# Patient Record
Sex: Female | Born: 1940 | Race: White | Hispanic: No | Marital: Married | Smoking: Never smoker
Health system: Southern US, Community
[De-identification: ages and names within clinical notes are randomized; demographics above are authoritative.]

## PROBLEM LIST (undated history)

## (undated) DIAGNOSIS — M8580 Other specified disorders of bone density and structure, unspecified site: Secondary | ICD-10-CM

## (undated) DIAGNOSIS — H269 Unspecified cataract: Secondary | ICD-10-CM

## (undated) DIAGNOSIS — N189 Chronic kidney disease, unspecified: Secondary | ICD-10-CM

## (undated) DIAGNOSIS — E079 Disorder of thyroid, unspecified: Secondary | ICD-10-CM

## (undated) DIAGNOSIS — T7840XA Allergy, unspecified, initial encounter: Secondary | ICD-10-CM

## (undated) HISTORY — PX: BLADDER SUSPENSION: SHX72

## (undated) HISTORY — PX: POLYPECTOMY: SHX149

## (undated) HISTORY — PX: BREAST REDUCTION SURGERY: SHX8

## (undated) HISTORY — DX: Other specified disorders of bone density and structure, unspecified site: M85.80

## (undated) HISTORY — PX: COLONOSCOPY: SHX174

## (undated) HISTORY — DX: Unspecified cataract: H26.9

## (undated) HISTORY — DX: Chronic kidney disease, unspecified: N18.9

## (undated) HISTORY — DX: Allergy, unspecified, initial encounter: T78.40XA

## (undated) HISTORY — PX: COSMETIC SURGERY: SHX468

---

## 1998-11-04 ENCOUNTER — Other Ambulatory Visit: Admission: RE | Admit: 1998-11-04 | Discharge: 1998-11-04 | Payer: Self-pay | Admitting: Obstetrics and Gynecology

## 2000-01-13 ENCOUNTER — Other Ambulatory Visit: Admission: RE | Admit: 2000-01-13 | Discharge: 2000-01-13 | Payer: Self-pay | Admitting: *Deleted

## 2001-01-12 ENCOUNTER — Other Ambulatory Visit: Admission: RE | Admit: 2001-01-12 | Discharge: 2001-01-12 | Payer: Self-pay | Admitting: *Deleted

## 2002-02-01 ENCOUNTER — Other Ambulatory Visit: Admission: RE | Admit: 2002-02-01 | Discharge: 2002-02-01 | Payer: Self-pay | Admitting: Obstetrics and Gynecology

## 2002-10-12 ENCOUNTER — Encounter: Payer: Self-pay | Admitting: Internal Medicine

## 2002-11-09 ENCOUNTER — Encounter: Payer: Self-pay | Admitting: Internal Medicine

## 2003-02-05 ENCOUNTER — Other Ambulatory Visit: Admission: RE | Admit: 2003-02-05 | Discharge: 2003-02-05 | Payer: Self-pay | Admitting: Obstetrics and Gynecology

## 2004-09-30 ENCOUNTER — Ambulatory Visit: Payer: Self-pay

## 2005-08-27 ENCOUNTER — Encounter: Admission: RE | Admit: 2005-08-27 | Discharge: 2005-08-27 | Payer: Self-pay | Admitting: Internal Medicine

## 2005-10-04 ENCOUNTER — Emergency Department (HOSPITAL_COMMUNITY): Admission: EM | Admit: 2005-10-04 | Discharge: 2005-10-04 | Payer: Self-pay | Admitting: Emergency Medicine

## 2006-08-11 ENCOUNTER — Ambulatory Visit (HOSPITAL_COMMUNITY): Admission: RE | Admit: 2006-08-11 | Discharge: 2006-08-11 | Payer: Self-pay | Admitting: Obstetrics and Gynecology

## 2009-01-16 DIAGNOSIS — R110 Nausea: Secondary | ICD-10-CM

## 2009-01-16 DIAGNOSIS — E039 Hypothyroidism, unspecified: Secondary | ICD-10-CM | POA: Insufficient documentation

## 2009-01-16 DIAGNOSIS — R197 Diarrhea, unspecified: Secondary | ICD-10-CM

## 2009-01-17 ENCOUNTER — Ambulatory Visit: Payer: Self-pay | Admitting: Internal Medicine

## 2009-01-18 ENCOUNTER — Telehealth: Payer: Self-pay | Admitting: Internal Medicine

## 2009-01-28 ENCOUNTER — Telehealth: Payer: Self-pay | Admitting: Internal Medicine

## 2009-01-30 ENCOUNTER — Encounter: Payer: Self-pay | Admitting: Internal Medicine

## 2010-08-05 NOTE — Letter (Signed)
Summary: Letter to Dr. Waynard Edwards  Letter to Dr. Waynard Edwards   Imported By: Harlow Mares CMA (AAMA) 01/17/2009 14:56:01  _____________________________________________________________________  External Attachment:    Type:   Image     Comment:   External Document

## 2010-11-21 NOTE — Letter (Signed)
January 18, 2009    Mark A. Perini, MD  [address]  [place], [place] [address]   RE:  Pamela, Tran  MRN:  [id number]  /  DOB:  [birth year]   Dear Loraine Leriche:   I saw [name] in the office yesterday for occasional fecal  incontinence and fecal urgency.  One of the possible causes of her  condition may be her Levoxyl dose which she takes for hypothyroidism.  Her TSH is normal, but on the low side.  I also found out that she may  have a pelvic relaxation and decreased rectal sphincter tone, but I  would like to ask if you could look into her dose of the Levoxyl and  consider reducing the dose to see whether her bowel habits would  normalize.  I have put her on Levbid as an antispasmodic.  Her tissue  transglutaminase levels are normal.  I appreciate your sharing this nice  patient  with me.    Sincerely,      Hedwig Morton. Juanda Chance, MD  Electronically Signed    DMB/MedQ  DD: 01/18/2009  DT: 01/19/2009  Job #: [id number]

## 2010-11-21 NOTE — Op Note (Signed)
[name], [name]            ACCOUNT NO.:  [id number]   MEDICAL RECORD NO.:  [id number]          PATIENT TYPE:  AMB   LOCATION:  SDC                           FACILITY:  [facility]   PHYSICIAN:  Guy Sandifer. Henderson Cloud, M.D. DATE OF BIRTH:  [birth year]   DATE OF PROCEDURE:  08/11/2006  DATE OF DISCHARGE:                               OPERATIVE REPORT   PREOPERATIVE DIAGNOSIS:  Stress urinary continence.   POSTOPERATIVE DIAGNOSIS:  Stress urinary continence.   PROCEDURE:  Transobturator sling, Halo.   SURGEON:  Guy Sandifer. Henderson Cloud, MD   ANESTHESIA:  General with LMA.   ESTIMATED BLOOD LOSS:  Drops.   INDICATIONS AND CONSENT:  This patient is a 70 year old married white  female, G3, P2, with worsening symptoms of stress incontinence.  Details  are dictated in the history and physical.  Midurethral sling has been  discussed preoperatively.  Potential risks and complications are  discussed preoperatively including but limited to infection, organ  damage, bleeding requiring transfusion of blood products and possible  transfusion reaction, HIV and hepatitis acquisition, DVT, PE, pneumonia,  fistula formation, postoperative dyspareunia, erosion, persistent or  recurrent incontinence, persistent inability to urinate, prolonged  catheterization postoperatively, and return to the OR.  All questions  have been answered and consent is signed and on the chart.   PROCEDURE:  The patient was taken to the operating room, where she is  identified, placed in dorsal supine position and general anesthesia is  secured via LMA.  She is then placed in the dorsal lithotomy position,  where she is prepped and draped in a sterile fashion.  A Foley catheter  is placed in the bladder, the bladder is drained.  The anterior vaginal  mucosa below the mid urethra is then injected with 0.5% lidocaine with  1:200,000 epinephrine.  A small linear incision is made and dissection  is carried out bilaterally with the  Strully scissors to the urogenital  diaphragm.  The point of incision for the transobturator needles  bilaterally is carefully palpated and is below the tendon bilaterally.  It is then marked, injected, and a skin incision was made.  The Halo  needle is then passed bilaterally and the tip is controlled with the  examining finger bilaterally.  The Foley catheter is then removed and  cystoscopy is carried out with 70-degree scope.  Inspection reveals no  foreign bodies, no evidence of perforation, and good a puff of urine  from the ureters bilaterally.  No evidence of perforation of the urethra  is noted.  The scope is removed, Foley catheter is replaced, and the  bladder is drained.  The polypropylene mesh sling is then placed on the  needle tips and withdrawn back through the incisions.  The sheath is  removed.  The sling is resting on the suburethral tissue.  However, a  Kelly clamp placed below it can be rotated perpendicular to the floor  with no  resistance.  The sling is trimmed at the skin bilaterally.  The vaginal  incision is closed with running locking 2-0 Vicryl suture.  Dermabond is  placed on the skin incisions.  All counts are correct.  The patient is  awakened, taken to the recovery room in stable condition.      Guy Sandifer Henderson Cloud, M.D.  Electronically Signed     JET/MEDQ  D:  08/11/2006  T:  08/11/2006  Job:  [id number]

## 2010-11-21 NOTE — H&P (Signed)
[name], [name]            ACCOUNT NO.:  [id number]   MEDICAL RECORD NO.:  [id number]          PATIENT TYPE:  AMB   LOCATION:                                FACILITY:  [facility]   PHYSICIAN:  Guy Sandifer. Henderson Cloud, M.D. DATE OF BIRTH:  26-Sep-1940   DATE OF ADMISSION:  08/11/2006  DATE OF DISCHARGE:                              HISTORY & PHYSICAL   CHIEF COMPLAINT:  Urinary incontinence.   HISTORY OF PRESENT ILLNESS:  This patient is a 70 year old married,  white female, G3, P2 with progressively worsening symptoms of urine loss  with coughing and sneezing.  She has to wear pads constantly.  A 11-month  trial of Detrol was of no help.  Evaluation with her urologist was  consistent with stress urinary incontinence.  After discussion of the  options, she is being admitted for a midurethral sling. The potential  risks and complications have been discussed preoperatively.   PAST MEDICAL HISTORY:  1. Hypothyroidism.  2. Osteoporosis.   PAST SURGICAL HISTORY:  Reduction mammoplasty.   OBSTETRIC HISTORY:  One termination, and two vaginal deliveries.   FAMILY HISTORY:  Diabetes in mother, heart attack in mother, and  diabetes in cousins.   MEDICATIONS:  Levoxyl, Fosamax, and allergy shots.   ALLERGIES:  PENICILLIN leading to swelling in her veins.   SOCIAL HISTORY:  Denies tobacco, alcohol, or drug abuse.   REVIEW OF SYSTEMS:  NEUROLOGIC:  Denies headache.  CARDIORESPIRATORY:  No chest pain.  PULMONARY:  Denies shortness of breath.  GASTROINTESTINAL:  Denies recent changes in bowel habits.   PHYSICAL EXAMINATION:  VITAL SIGNS:  Height 5 feet 1-1/2 inches, weight  115 pounds.  Blood pressure 130/70.  HEENT:  Without thyromegaly.  LUNGS:  Clear to auscultation.  HEART:  Regular rate and rhythm.  BREASTS:  Not examined.  ABDOMEN:  Nontender.  PELVIC EXAM:  Vulva, vagina, and cervix without lesions.  There is  hypermobility of the urethrovesical angle.  First degree cystocele.  Uterus is normal in size, mobile, with good support, nontender.  Adnexa  nontender without masses.  EXTREMITIES:  Grossly within normal limits.  NEUROLOGICAL EXAM:  Grossly within normal limits.   ASSESSMENT:  Stress urinary incontinence.   PLAN:  Midurethral sling.      Guy Sandifer Henderson Cloud, M.D.  Electronically Signed     JET/MEDQ  D:  08/10/2006  T:  08/10/2006  Job:  [id number]

## 2012-10-18 ENCOUNTER — Emergency Department (HOSPITAL_COMMUNITY): Payer: Medicare Other | Admitting: Anesthesiology

## 2012-10-18 ENCOUNTER — Encounter (HOSPITAL_COMMUNITY): Admission: EM | Disposition: A | Discharge status: Home / Self Care | Payer: Self-pay | Source: Home / Self Care

## 2012-10-18 ENCOUNTER — Encounter (HOSPITAL_COMMUNITY): Payer: Self-pay | Admitting: Emergency Medicine

## 2012-10-18 ENCOUNTER — Observation Stay (HOSPITAL_COMMUNITY)
Admission: EM | Admit: 2012-10-18 | Discharge: 2012-10-18 | Disposition: A | Discharge status: Home / Self Care | Payer: Medicare Other | Attending: Plastic Surgery | Admitting: Plastic Surgery

## 2012-10-18 ENCOUNTER — Encounter (HOSPITAL_COMMUNITY): Payer: Self-pay | Admitting: Anesthesiology

## 2012-10-18 DIAGNOSIS — R220 Localized swelling, mass and lump, head: Secondary | ICD-10-CM

## 2012-10-18 DIAGNOSIS — Y838 Other surgical procedures as the cause of abnormal reaction of the patient, or of later complication, without mention of misadventure at the time of the procedure: Secondary | ICD-10-CM | POA: Insufficient documentation

## 2012-10-18 DIAGNOSIS — IMO0002 Reserved for concepts with insufficient information to code with codable children: Principal | ICD-10-CM | POA: Insufficient documentation

## 2012-10-18 HISTORY — PX: IRRIGATION AND DEBRIDEMENT OF WOUND WITH SPLIT THICKNESS SKIN GRAFT: SHX5879

## 2012-10-18 HISTORY — DX: Disorder of thyroid, unspecified: E07.9

## 2012-10-18 LAB — BASIC METABOLIC PANEL
BUN: 22 mg/dL (ref 6–23)
Calcium: 9.1 mg/dL (ref 8.4–10.5)
Chloride: 101 mEq/L (ref 96–112)
Potassium: 3.5 mEq/L (ref 3.5–5.1)
Sodium: 138 mEq/L (ref 135–145)

## 2012-10-18 LAB — CBC WITH DIFFERENTIAL/PLATELET
Basophils Absolute: 0.1 10*3/uL (ref 0.0–0.1)
Basophils Relative: 1 % (ref 0–1)
Eosinophils Relative: 4 % (ref 0–5)
MCHC: 35.9 g/dL (ref 30.0–36.0)
MCV: 86.9 fL (ref 78.0–100.0)
Monocytes Absolute: 0.3 10*3/uL (ref 0.1–1.0)
Platelets: 224 10*3/uL (ref 150–400)
RBC: 4.36 MIL/uL (ref 3.87–5.11)
RDW: 12.3 % (ref 11.5–15.5)

## 2012-10-18 SURGERY — IRRIGATION AND DEBRIDEMENT OF WOUND WITH SPLIT THICKNESS SKIN GRAFT
Anesthesia: General | Site: Face | Laterality: Bilateral | Wound class: Clean

## 2012-10-18 MED ORDER — HYDROCODONE-ACETAMINOPHEN 5-325 MG PO TABS: 1.0000 | ORAL_TABLET | ORAL | Status: DC | PRN

## 2012-10-18 MED ORDER — FENTANYL CITRATE 0.05 MG/ML IJ SOLN
25.0000 ug | INTRAMUSCULAR | Status: DC | PRN
  Administered 2012-10-18 (×2): 25 ug via INTRAVENOUS

## 2012-10-18 MED ORDER — DEXTROSE-NACL 5-0.45 % IV SOLN
INTRAVENOUS | Status: DC
  Administered 2012-10-18: 05:00:00 via INTRAVENOUS

## 2012-10-18 MED ORDER — LACTATED RINGERS IV SOLN
INTRAVENOUS | Status: DC | PRN
  Administered 2012-10-18: 03:00:00 via INTRAVENOUS

## 2012-10-18 MED ORDER — OXYCODONE HCL 5 MG PO TABS: 5.0000 mg | ORAL_TABLET | Freq: Once | ORAL | Status: DC | PRN

## 2012-10-18 MED ORDER — ONDANSETRON HCL 4 MG/2ML IJ SOLN
INTRAMUSCULAR | Status: DC | PRN
  Administered 2012-10-18: 4 mg via INTRAVENOUS

## 2012-10-18 MED ORDER — SUCCINYLCHOLINE CHLORIDE 20 MG/ML IJ SOLN
INTRAMUSCULAR | Status: DC | PRN
  Administered 2012-10-18: 80 mg via INTRAVENOUS

## 2012-10-18 MED ORDER — EPHEDRINE SULFATE 50 MG/ML IJ SOLN
INTRAMUSCULAR | Status: DC | PRN
  Administered 2012-10-18 (×2): 5 mg via INTRAVENOUS

## 2012-10-18 MED ORDER — FENTANYL CITRATE 0.05 MG/ML IJ SOLN
INTRAMUSCULAR | Status: AC
  Filled 2012-10-18: qty 2

## 2012-10-18 MED ORDER — LIDOCAINE HCL (CARDIAC) 20 MG/ML IV SOLN
INTRAVENOUS | Status: DC | PRN
  Administered 2012-10-18: 70 mg via INTRAVENOUS

## 2012-10-18 MED ORDER — PROPOFOL 10 MG/ML IV BOLUS
INTRAVENOUS | Status: DC | PRN
  Administered 2012-10-18: 100 mg via INTRAVENOUS

## 2012-10-18 MED ORDER — OXYCODONE HCL 5 MG/5ML PO SOLN: 5.0000 mg | Freq: Once | ORAL | Status: DC | PRN

## 2012-10-18 MED ORDER — ONDANSETRON HCL 4 MG/2ML IJ SOLN: 4.0000 mg | Freq: Once | INTRAMUSCULAR | Status: DC | PRN

## 2012-10-18 MED ORDER — CIPROFLOXACIN IN D5W 400 MG/200ML IV SOLN
INTRAVENOUS | Status: DC | PRN
  Administered 2012-10-18: 400 mg via INTRAVENOUS

## 2012-10-18 MED ORDER — MIDAZOLAM HCL 5 MG/5ML IJ SOLN
INTRAMUSCULAR | Status: DC | PRN
  Administered 2012-10-18: 2 mg via INTRAVENOUS

## 2012-10-18 MED ORDER — SODIUM CHLORIDE 0.9 % IR SOLN
Status: DC | PRN
  Administered 2012-10-18: 1

## 2012-10-18 SURGICAL SUPPLY — 31 items
BANDAGE ELASTIC 4 VELCRO ST LF (GAUZE/BANDAGES/DRESSINGS) ×1 IMPLANT
BANDAGE GAUZE ELAST BULKY 4 IN (GAUZE/BANDAGES/DRESSINGS) ×1 IMPLANT
CLOTH BEACON ORANGE TIMEOUT ST (SAFETY) ×2 IMPLANT
COVER SURGICAL LIGHT HANDLE (MISCELLANEOUS) ×2 IMPLANT
DRAPE U-SHAPE 76X120 STRL (DRAPES) ×1 IMPLANT
ELECT NDL TIP 2.8 STRL (NEEDLE) IMPLANT
ELECT NEEDLE TIP 2.8 STRL (NEEDLE) ×2 IMPLANT
ELECT REM PT RETURN 9FT ADLT (ELECTROSURGICAL) ×2
ELECTRODE REM PT RTRN 9FT ADLT (ELECTROSURGICAL) ×1 IMPLANT
GAUZE SPONGE 4X4 16PLY XRAY LF (GAUZE/BANDAGES/DRESSINGS) ×4 IMPLANT
GAUZE XEROFORM 1X8 LF (GAUZE/BANDAGES/DRESSINGS) ×2 IMPLANT
GLOVE BIO SURGEON STRL SZ7.5 (GLOVE) ×2 IMPLANT
GLOVE BIOGEL PI IND STRL 8 (GLOVE) ×2 IMPLANT
GLOVE BIOGEL PI INDICATOR 8 (GLOVE) ×2
GOWN STRL NON-REIN LRG LVL3 (GOWN DISPOSABLE) ×4 IMPLANT
GOWN STRL REIN 3XL LVL4 (GOWN DISPOSABLE) ×2 IMPLANT
KIT BASIN OR (CUSTOM PROCEDURE TRAY) ×2 IMPLANT
KIT ROOM TURNOVER OR (KITS) ×2 IMPLANT
NS IRRIG 1000ML POUR BTL (IV SOLUTION) ×3 IMPLANT
PACK GENERAL/GYN (CUSTOM PROCEDURE TRAY) ×2 IMPLANT
PAD ARMBOARD 7.5X6 YLW CONV (MISCELLANEOUS) ×4 IMPLANT
SPONGE GAUZE 4X4 12PLY (GAUZE/BANDAGES/DRESSINGS) ×3 IMPLANT
STAPLER VISISTAT 35W (STAPLE) ×2 IMPLANT
STRIP CLOSURE SKIN 1/4X4 (GAUZE/BANDAGES/DRESSINGS) ×1 IMPLANT
SUT CHROMIC 4 0 PS 2 18 (SUTURE) ×4 IMPLANT
SUT ETHILON 5 0 PS 2 18 (SUTURE) ×6 IMPLANT
SUT PROLENE 3 0 PS 1 (SUTURE) ×2 IMPLANT
SUT VIC AB 3-0 54X BRD REEL (SUTURE) ×1 IMPLANT
SUT VIC AB 3-0 BRD 54 (SUTURE) ×2
TOWEL OR 17X26 10 PK STRL BLUE (TOWEL DISPOSABLE) ×4 IMPLANT
WATER STERILE IRR 1000ML POUR (IV SOLUTION) ×2 IMPLANT

## 2012-10-18 NOTE — Anesthesia Postprocedure Evaluation (Signed)
  Anesthesia Post-op Note  Patient: [name]  Procedure(s) Performed: Procedure(s) with comments: Evacuation of hematoma right face (Bilateral) - Removal of left face stitches  Patient Location: PACU  Anesthesia Type:General  Level of Consciousness: awake, alert  and oriented  Airway and Oxygen Therapy: Patient Spontanous Breathing and Patient connected to nasal cannula oxygen  Post-op Pain: none  Post-op Assessment: Post-op Vital signs reviewed  Post-op Vital Signs: Reviewed  Complications: No apparent anesthesia complications

## 2012-10-18 NOTE — H&P (Signed)
Chief Complaint:  I have a large swelling on my right face.  HPI:  This is a 72 y o white female who is 7 days post face and neck lift.  She has had an uncomplicated recovery until awakening this evening with right temple pain and obvious swelling.  The swelling has enlarged.  She denies shortness of breath.  Patient was brought to the [facility] EDP for evaluationg  PMH:  No history of heart disease, pulmonary disease.  PSH:  Breast Reduction 20 years ago  Allergies:  PCN, which causes swelling  Medications: Levothyroid and estradiol  Physical Exam:  Caridiac:  Nl S1S2.  Pulmonary Clear.  Right side of face with marked swelling.  Blanching of skin in front of right ear.  Swelling spreading into right neck.  Some pain with palpation.    Assessment:  Right facial hematoma secondary to face lift one week ago.    Plan:  To OR for emergency Incision and Drainage.

## 2012-10-18 NOTE — Anesthesia Preprocedure Evaluation (Addendum)
Anesthesia Evaluation  Patient identified by MRN, date of birth, ID band Patient awake    Reviewed: Allergy & Precautions, H&P , NPO status   Airway Mallampati: III TM Distance: >3 FB   Mouth opening: Limited Mouth Opening Comment: Limited mouth opening due to pain and swelling Dental  (+) Teeth Intact and Dental Advisory Given   Pulmonary  breath sounds clear to auscultation        Cardiovascular Rhythm:Regular Rate:Normal     Neuro/Psych    GI/Hepatic   Endo/Other  Hypothyroidism   Renal/GU      Musculoskeletal   Abdominal   Peds  Hematology   Anesthesia Other Findings Poor mouth opening due to R facial hematoma.  Reproductive/Obstetrics                          Anesthesia Physical Anesthesia Plan  ASA: II and emergent  Anesthesia Plan: General   Post-op Pain Management:    Induction: Intravenous, Rapid sequence and Cricoid pressure planned  Airway Management Planned: Oral ETT  Additional Equipment:   Intra-op Plan:   Post-operative Plan: Extubation in OR  Informed Consent: I have reviewed the patients History and Physical, chart, labs and discussed the procedure including the risks, benefits and alternatives for the proposed anesthesia with the patient or authorized representative who has indicated his/her understanding and acceptance.   Dental advisory given  Plan Discussed with: Anesthesiologist, Surgeon and CRNA  Anesthesia Plan Comments:        Anesthesia Quick Evaluation

## 2012-10-18 NOTE — Op Note (Signed)
Operative Note  Pre-op Diagnosis: Right face hematoma Post-op diagnosis: Same Operation Performed: Incision and Drainage Right Facial Hematoma Surgeon: Ilean Skill, II MD  Indications for Surgery: This is a 72 yo female who is one week post face and neck lift.  She has had no problems until awakening at about 1:00 am on October 18, 2012 with a pain in her right temple.  This pain was followed soon by abrupt swelling in the right side of her face.  She called me and I had the patient meet at the [facility].  The diagnosis of right sided facial hematoma was made.  The patient was taken emergently to the operating room for drainage.  There is no history of trauma this evening.  Patient did take an ibuprofen earlier in the evening.  Anesthesia:  General Endotracheal Anesthesia  Description of Surgery:  The patient was taken to the OR, placed in a supine position with all pressure points inspected and padded.  PAS hose and Bear hugger were placed.  The patient was given general endotracheal anesthesia.  The face was prepped with Betadine and draped with sterile drapes.  The temporal staples and the preauricular sutures were removed.  A hematoma was identified and evacuated.  A small arterial bleeder was identified in the temporal area, in the pathway of the temporal branch of the facial nerve.  The vessel was tied with a 3-0 vicryl suture.  Complete hemostostasis was achieved with the ligation of the small vessel.  The remainder of the hematoma in the face and neck was evacuated.  The post-auricular sutures were removed to allow visualization of the neck.  No bleeding was identified.  The face was copiously irrigated and inspected for any additional bleeding.  None was seen.  The incision was closed with staples in the temporal area, 5-0 Nylon in the preauricular area and along the mastoid and 3-0 Chromic along the posterior concha.  The face was cleansed and dried.  A compression wrap was  place around the face.  The patient was awakened, extubated and taken to the PACU in satisfactory condition.  EBL was about 100 cc, all hematoma.

## 2012-10-18 NOTE — Anesthesia Procedure Notes (Signed)
Procedure Name: Intubation Date/Time: 10/18/2012 3:09 AM Performed by: Molli Hazard Pre-anesthesia Checklist: Patient identified, Emergency Drugs available, Suction available and Patient being monitored Patient Re-evaluated:Patient Re-evaluated prior to inductionOxygen Delivery Method: Circle system utilized Preoxygenation: Pre-oxygenation with 100% oxygen Intubation Type: IV induction Ventilation: Mask ventilation without difficulty Tube type: Oral Tube size: 7.0 mm Number of attempts: 1 Airway Equipment and Method: Video-laryngoscopy Placement Confirmation: ETT inserted through vocal cords under direct vision,  positive ETCO2 and breath sounds checked- equal and bilateral Secured at: 22 cm Tube secured with: Tape Dental Injury: Teeth and Oropharynx as per pre-operative assessment

## 2012-10-18 NOTE — Discharge Summary (Signed)
DC Note  Patient now about 16 hours post  I and D facial hematoma.  Patient has no pain.  She is tolerating a regular diet.  Some facial swelling on the right side.  No evidence of recurrent hematoma.  Patient is ready for dc home.   Admission Diagnosis: facial hematoma DC diagnosis: same Condition: Improved, hematoma resolved. Follow up in my office in one week. DC instructions verbally given to sleep with head elevated and continue to use ice packs.  Keep compression wrap in placed for 48 hours.  Delia Chimes, MD

## 2012-10-18 NOTE — Transfer of Care (Signed)
Immediate Anesthesia Transfer of Care Note  Patient: [name]  Procedure(s) Performed: Procedure(s) with comments: Evacuation of hematoma right face (Bilateral) - Removal of left face stitches  Patient Location: PACU  Anesthesia Type:General  Level of Consciousness: awake, alert  and oriented  Airway & Oxygen Therapy: Patient Spontanous Breathing and Patient connected to nasal cannula oxygen  Post-op Assessment: Report given to PACU RN, Post -op Vital signs reviewed and stable and Patient moving all extremities X 4  Post vital signs: Reviewed and stable  Complications: No apparent anesthesia complications

## 2012-10-18 NOTE — ED Notes (Signed)
Patient with facial hematoma, patient going to OR with Dr Benna Dunks.

## 2012-10-18 NOTE — ED Notes (Signed)
Report to CRNA.  

## 2012-10-18 NOTE — ED Notes (Signed)
To OR with Dr Benna Dunks

## 2012-10-18 NOTE — ED Notes (Signed)
Patient here to go to OR with Dr Benna Dunks for right facial hematoma.  Patient using bag of peas on face for swelling control.  Swelling around face.

## 2012-10-19 ENCOUNTER — Encounter (HOSPITAL_COMMUNITY): Payer: Self-pay | Admitting: Plastic Surgery

## 2012-10-26 ENCOUNTER — Encounter: Payer: Self-pay | Admitting: Internal Medicine

## 2013-10-18 ENCOUNTER — Encounter: Payer: Self-pay | Admitting: Internal Medicine

## 2015-05-22 ENCOUNTER — Encounter: Payer: Self-pay | Admitting: Gastroenterology

## 2015-06-20 ENCOUNTER — Other Ambulatory Visit (INDEPENDENT_AMBULATORY_CARE_PROVIDER_SITE_OTHER): Payer: Medicare Other

## 2015-06-20 ENCOUNTER — Encounter: Payer: Self-pay | Admitting: Gastroenterology

## 2015-06-20 ENCOUNTER — Ambulatory Visit (INDEPENDENT_AMBULATORY_CARE_PROVIDER_SITE_OTHER): Payer: Medicare Other | Admitting: Gastroenterology

## 2015-06-20 VITALS — BP 104/62 | HR 80 | Ht 61.0 in | Wt 107.7 lb

## 2015-06-20 DIAGNOSIS — K921 Melena: Secondary | ICD-10-CM

## 2015-06-20 DIAGNOSIS — R195 Other fecal abnormalities: Secondary | ICD-10-CM | POA: Diagnosis not present

## 2015-06-20 LAB — BASIC METABOLIC PANEL
BUN: 26 mg/dL — AB (ref 6–23)
CALCIUM: 9.4 mg/dL (ref 8.4–10.5)
CO2: 31 meq/L (ref 19–32)
Chloride: 104 mEq/L (ref 96–112)
Creatinine, Ser: 0.68 mg/dL (ref 0.40–1.20)
GFR: 89.76 mL/min (ref >60.00)
GLUCOSE: 95 mg/dL (ref 70–99)
Potassium: 4.3 mEq/L (ref 3.5–5.1)
Sodium: 140 mEq/L (ref 135–145)

## 2015-06-20 LAB — IBC PANEL
IRON: 89 ug/dL (ref 42–145)
Saturation Ratios: 23.1 % (ref 20.0–50.0)
Transferrin: 275 mg/dL (ref 212.0–360.0)

## 2015-06-20 LAB — CBC WITH DIFFERENTIAL/PLATELET
BASOS PCT: 0.8 % (ref 0.0–3.0)
Basophils Absolute: 0 10*3/uL (ref 0.0–0.1)
EOS PCT: 3 % (ref 0.0–5.0)
Eosinophils Absolute: 0.2 10*3/uL (ref 0.0–0.7)
HEMATOCRIT: 40.6 % (ref 36.0–46.0)
HEMOGLOBIN: 13.6 g/dL (ref 12.0–15.0)
LYMPHS PCT: 25 % (ref 12.0–46.0)
Lymphs Abs: 1.3 10*3/uL (ref 0.7–4.0)
MCHC: 33.4 g/dL (ref 30.0–36.0)
MCV: 92.4 fl (ref 78.0–100.0)
MONOS PCT: 8.9 % (ref 3.0–12.0)
Monocytes Absolute: 0.5 10*3/uL (ref 0.1–1.0)
Neutro Abs: 3.3 10*3/uL (ref 1.4–7.7)
Neutrophils Relative %: 62.3 % (ref 43.0–77.0)
Platelets: 218 10*3/uL (ref 150.0–400.0)
RBC: 4.4 Mil/uL (ref 3.87–5.11)
RDW: 13.5 % (ref 11.5–15.5)
WBC: 5.3 10*3/uL (ref 4.0–10.5)

## 2015-06-20 LAB — FERRITIN: Ferritin: 40.9 ng/mL (ref 10.0–291.0)

## 2015-06-20 MED ORDER — NA SULFATE-K SULFATE-MG SULF 17.5-3.13-1.6 GM/177ML PO SOLN: 1.0000 | Freq: Once | ORAL | Status: DC

## 2015-06-20 NOTE — Patient Instructions (Signed)

## 2015-06-20 NOTE — Progress Notes (Signed)
[name]    MR:[id number]    11-Feb-1941  Primary Care Physician:PERINI,MARK A, MD  Referring Physician: Crist Infante, MD [address] [place], [place] [address]  Chief complaint:  Hemoccult positive stool   HPI: 73 year old female here for evaluation for Hemoccult-positive stool. She denies any symptoms other than occasional constipation for which she takes MiraLAX as needed. Her last colonoscopy was 11-12 years ago, was normal she did not tolerate the prep well and did not wish to undergo any more colonoscopies. Denies any nausea, vomiting, abdominal pain, melena or bright red blood per rectum    Outpatient Encounter Prescriptions as of 06/20/2015  Medication Sig  . Cholecalciferol (VITAMIN D) 2000 UNITS CAPS Take by mouth 2 (two) times daily.  . Cyanocobalamin (VITAMIN B12 PO) Take by mouth.  . levothyroxine (SYNTHROID, LEVOTHROID) 50 MCG tablet Take 50 mcg by mouth daily before breakfast.  . Loratadine (CLARITIN PO) Take by mouth. As needed for allergies  . omega-3 acid ethyl esters (LOVAZA) 1 G capsule Take by mouth 2 (two) times daily.  . Probiotic Product (PROBIOTIC DAILY PO) Take by mouth 2 (two) times daily.   No facility-administered encounter medications on file as of 06/20/2015.    Allergies as of 06/20/2015 - Review Complete 10/18/2012  Allergen Reaction Noted  . Penicillins  01/16/2009    Past Medical History  Diagnosis Date  . Thyroid disease     Past Surgical History  Procedure Laterality Date  . Cosmetic surgery    . Irrigation and debridement of wound with split thickness skin graft Bilateral 10/18/2012    Procedure: Evacuation of hematoma right face;  Surgeon: Renee Pain, MD;  Location: [facility];  Service: Plastics;  Laterality: Bilateral;  Removal of left face stitches    History reviewed. No pertinent family history.  Social History   Social History  . Marital Status: Married    Spouse Name: N/A  . Number of Children: N/A    . Years of Education: N/A   Occupational History  . Not on file.   Social History Main Topics  . Smoking status: Never Smoker   . Smokeless tobacco: Not on file  . Alcohol Use: No  . Drug Use: No  . Sexual Activity: Not on file   Other Topics Concern  . Not on file   Social History Narrative      Review of systems: Review of Systems  Constitutional: Negative for fever and chills.  HENT: Negative.   Eyes: Negative for blurred vision.  Respiratory: Negative for cough, shortness of breath and wheezing.   Cardiovascular: Negative for chest pain and palpitations.  Gastrointestinal: as per HPI Genitourinary: Negative for dysuria, urgency, frequency and hematuria.  Musculoskeletal: Negative for myalgias, back pain and joint pain.  Skin: Negative for itching and rash.  Neurological: Negative for dizziness, tremors, focal weakness, seizures and loss of consciousness.  Endo/Heme/Allergies: Negative for environmental allergies.  Psychiatric/Behavioral: Negative for depression, suicidal ideas and hallucinations.  All other systems reviewed and are negative.   Physical Exam: Filed Vitals:   06/20/15 0828  BP: 104/62  Pulse: 80   Gen:      No acute distress HEENT:  EOMI, sclera anicteric Neck:     No masses; no thyromegaly Lungs:    Clear to auscultation bilaterally; normal respiratory effort CV:         Regular rate and rhythm; no murmurs Abd:      + bowel  sounds; soft, non-tender; no palpable masses, no distension Ext:    No edema; adequate peripheral perfusion Skin:      Warm and dry; no rash Neuro: alert and oriented x 3 Psych: normal mood and affect  Data Reviewed:  Colonoscopy 2004: appears to be fair prep, no polyps were detected at the time   Assessment and Plan/Recommendations:  74 year old female here with Hemoccult-positive stool and last colonoscopy with fair prep greater than 12 years ago She has no GI symptoms We'll schedule for colonoscopy with  Suprep We'll also obtain CBC, BMP, ferritin and iron studies  K. Denzil Magnuson , MD [phone] Mon-Fri 8a-5p [phone] after 5p, weekends, holidays

## 2015-07-26 ENCOUNTER — Encounter: Payer: Self-pay | Admitting: Gastroenterology

## 2015-07-26 ENCOUNTER — Ambulatory Visit (AMBULATORY_SURGERY_CENTER): Payer: Medicare Other | Admitting: Gastroenterology

## 2015-07-26 VITALS — BP 134/57 | HR 54 | Temp 98.1°F | Resp 30 | Ht 61.0 in | Wt 107.0 lb

## 2015-07-26 DIAGNOSIS — R195 Other fecal abnormalities: Secondary | ICD-10-CM

## 2015-07-26 DIAGNOSIS — D124 Benign neoplasm of descending colon: Secondary | ICD-10-CM

## 2015-07-26 DIAGNOSIS — D123 Benign neoplasm of transverse colon: Secondary | ICD-10-CM

## 2015-07-26 DIAGNOSIS — D122 Benign neoplasm of ascending colon: Secondary | ICD-10-CM | POA: Diagnosis not present

## 2015-07-26 MED ORDER — SODIUM CHLORIDE 0.9 % IV SOLN: 500.0000 mL | INTRAVENOUS | Status: DC

## 2015-07-26 NOTE — Progress Notes (Signed)
Called to room to assist during endoscopic procedure.  Patient ID and intended procedure confirmed with present staff. Received instructions for my participation in the procedure from the performing physician.  

## 2015-07-26 NOTE — Progress Notes (Signed)
To recovery, report to [place], RN, VSS 

## 2015-07-26 NOTE — Patient Instructions (Signed)
YOU HAD AN ENDOSCOPIC PROCEDURE TODAY AT THE [facility]:   Refer to the procedure report that was given to you for any specific questions about what was found during the examination.  If the procedure report does not answer your questions, please call your gastroenterologist to clarify.  If you requested that your care partner not be given the details of your procedure findings, then the procedure report has been included in a sealed envelope for you to review at your convenience later.  YOU SHOULD EXPECT: Some feelings of bloating in the abdomen. Passage of more gas than usual.  Walking can help get rid of the air that was put into your GI tract during the procedure and reduce the bloating. If you had a lower endoscopy (such as a colonoscopy or flexible sigmoidoscopy) you may notice spotting of blood in your stool or on the toilet paper. If you underwent a bowel prep for your procedure, you may not have a normal bowel movement for a few days.  Please Note:  You might notice some irritation and congestion in your nose or some drainage.  This is from the oxygen used during your procedure.  There is no need for concern and it should clear up in a day or so.  SYMPTOMS TO REPORT IMMEDIATELY:   Following lower endoscopy (colonoscopy or flexible sigmoidoscopy):  Excessive amounts of blood in the stool  Significant tenderness or worsening of abdominal pains  Swelling of the abdomen that is new, acute  Fever of 100F or higher    For urgent or emergent issues, a gastroenterologist can be reached at any hour by calling [phone].   DIET: Your first meal following the procedure should be a small meal and then it is ok to progress to your normal diet. Heavy or fried foods are harder to digest and may make you feel nauseous or bloated.  Likewise, meals heavy in dairy and vegetables can increase bloating.  Drink plenty of fluids but you should avoid alcoholic beverages for 24  hours.  ACTIVITY:  You should plan to take it easy for the rest of today and you should NOT DRIVE or use heavy machinery until tomorrow (because of the sedation medicines used during the test).    FOLLOW UP: Our staff will call the number listed on your records the next business day following your procedure to check on you and address any questions or concerns that you may have regarding the information given to you following your procedure. If we do not reach you, we will leave a message.  However, if you are feeling well and you are not experiencing any problems, there is no need to return our call.  We will assume that you have returned to your regular daily activities without incident.  If any biopsies were taken you will be contacted by phone or by letter within the next 1-3 weeks.  Please call us at [phone] if you have not heard about the biopsies in 3 weeks.    SIGNATURES/CONFIDENTIALITY: You and/or your care partner have signed paperwork which will be entered into your electronic medical record.  These signatures attest to the fact that that the information above on your After Visit Summary has been reviewed and is understood.  Full responsibility of the confidentiality of this discharge information lies with you and/or your care-partner.    Information on polyps & hemorrhoids given to you today 

## 2015-07-26 NOTE — Op Note (Signed)
[place]  [address] [place], [id number]   COLONOSCOPY PROCEDURE REPORT  PATIENT: Penny, Manolis  MR#: [id number] BIRTHDATE: 1940/12/30 , 74  yrs. old GENDER: female ENDOSCOPIST: Harl Bowie, MD REFERRED [id number] Perini, M.D. PROCEDURE DATE:  07/26/2015 PROCEDURE:   Colonoscopy, screening, Colonoscopy with snare polypectomy, and Colonoscopy with cold biopsy polypectomy First Screening Colonoscopy - Avg.  risk and is 50 yrs.  old or older - No.  Prior Negative Screening - Now for repeat screening. N/A Prior Negative Screening - Now for repeat screening.  10 or more years since last screening  History of Adenoma - Now for follow-up colonoscopy & has been > or = to 3 yrs.  N/A  Polyps removed today? Yes ASA CLASS:   Class II INDICATIONS:Screening for colonic neoplasia and Colorectal Neoplasm Risk Assessment for this procedure is average risk. MEDICATIONS: Propofol 250 mg IV  DESCRIPTION OF PROCEDURE:   After the risks benefits and alternatives of the procedure were thoroughly explained, informed consent was obtained.  The digital rectal exam revealed no abnormalities of the rectum.   The [id number]  endoscope was introduced through the anus and advanced to the terminal ileum which was intubated for a short distance. No adverse events experienced.   The quality of the prep was good.  The instrument was then slowly withdrawn as the colon was fully examined. Estimated blood loss is zero unless otherwise noted in this procedure report.   COLON FINDINGS: Four sessile polyps ranging between 5-15mm in size were found in the descending colon, transverse colon, and ascending colon.  A polypectomy was performed with a cold snare.  The resection was complete, the polyp tissue was completely retrieved and sent to histology.   A sessile polyp ranging between 3-65mm in size was found in the transverse colon.  A polypectomy was performed with cold  forceps.  The resection was complete, the polyp tissue was completely retrieved and sent to histology.   Small internal hemorrhoids were found.  Retroflexed views revealed internal hemorrhoids. The time to cecum = 3.3 Withdrawal time = 16.6   The scope was withdrawn and the procedure completed. COMPLICATIONS: There were no immediate complications.  ENDOSCOPIC IMPRESSION: 1.   Four sessile polyps ranging between 5-98mm in size were found in the descending colon, transverse colon, and ascending colon; polypectomy was performed with a cold snare 2.   Sessile polyp ranging between 3-32mm in size was found in the transverse colon; polypectomy was performed with cold forceps 3.   Small internal hemorrhoids  RECOMMENDATIONS: 1.  If the polyp(s) removed today are proven to be adenomatous (pre-cancerous) polyps, you will need a colonoscopy in 3 years. Otherwise you should continue to follow colorectal cancer screening guidelines for "routine risk" patients with a colonoscopy in 10 years.  You will receive a letter within 1-2 weeks with the results of your biopsy as well as final recommendations.  Please call my office if you have not received a letter after 3 weeks. 2.  Await pathology results  eSigned:  Harl Bowie, MD 07/26/2015 11:33 AM      PATIENT NAME:  Payzlee, [place] MR#: [id number]

## 2015-07-29 ENCOUNTER — Telehealth: Payer: Self-pay | Admitting: Emergency Medicine

## 2015-07-29 NOTE — Telephone Encounter (Signed)
  Follow up Call-  Call back number 07/26/2015  Post procedure Call Back phone  # [phone]  Permission to leave phone message Yes     Patient questions:  Do you have a fever, pain , or abdominal swelling? No. Pain Score  0 *  Have you tolerated food without any problems? Yes.    Have you been able to return to your normal activities? Yes.    Do you have any questions about your discharge instructions: Diet   No. Medications  No. Follow up visit  No.  Do you have questions or concerns about your Care? No.  Actions: * If pain score is 4 or above: No action needed, pain <4.

## 2015-08-07 ENCOUNTER — Encounter: Payer: Self-pay | Admitting: Gastroenterology

## 2016-04-01 ENCOUNTER — Encounter (HOSPITAL_COMMUNITY): Payer: Self-pay

## 2016-04-01 ENCOUNTER — Emergency Department (HOSPITAL_COMMUNITY)
Admission: EM | Admit: 2016-04-01 | Discharge: 2016-04-02 | Disposition: A | Discharge status: Home / Self Care | Payer: Medicare Other | Attending: Emergency Medicine | Admitting: Emergency Medicine

## 2016-04-01 DIAGNOSIS — Z79899 Other long term (current) drug therapy: Secondary | ICD-10-CM | POA: Diagnosis not present

## 2016-04-01 DIAGNOSIS — N200 Calculus of kidney: Secondary | ICD-10-CM | POA: Insufficient documentation

## 2016-04-01 DIAGNOSIS — R1030 Lower abdominal pain, unspecified: Secondary | ICD-10-CM | POA: Diagnosis present

## 2016-04-01 DIAGNOSIS — E039 Hypothyroidism, unspecified: Secondary | ICD-10-CM | POA: Insufficient documentation

## 2016-04-01 LAB — COMPREHENSIVE METABOLIC PANEL
ALT: 16 U/L (ref 14–54)
AST: 23 U/L (ref 15–41)
Albumin: 4.3 g/dL (ref 3.5–5.0)
Alkaline Phosphatase: 47 U/L (ref 38–126)
Anion gap: 9 (ref 5–15)
BILIRUBIN TOTAL: 0.7 mg/dL (ref 0.3–1.2)
BUN: 28 mg/dL — ABNORMAL HIGH (ref 6–20)
CALCIUM: 9.3 mg/dL (ref 8.9–10.3)
CO2: 23 mmol/L (ref 22–32)
Chloride: 107 mmol/L (ref 101–111)
Creatinine, Ser: 1.04 mg/dL — ABNORMAL HIGH (ref 0.44–1.00)
GFR calc Af Amer: 59 mL/min — ABNORMAL LOW (ref >60)
GFR, EST NON AFRICAN AMERICAN: 51 mL/min — AB (ref >60)
GLUCOSE: 140 mg/dL — AB (ref 65–99)
POTASSIUM: 4.1 mmol/L (ref 3.5–5.1)
Sodium: 139 mmol/L (ref 135–145)
TOTAL PROTEIN: 6.9 g/dL (ref 6.5–8.1)

## 2016-04-01 LAB — URINALYSIS, ROUTINE W REFLEX MICROSCOPIC
Bilirubin Urine: NEGATIVE
Glucose, UA: NEGATIVE mg/dL
Hgb urine dipstick: NEGATIVE
Ketones, ur: NEGATIVE mg/dL
NITRITE: NEGATIVE
PH: 7 (ref 5.0–8.0)
Protein, ur: NEGATIVE mg/dL
SPECIFIC GRAVITY, URINE: 1.019 (ref 1.005–1.030)

## 2016-04-01 LAB — CBC
HEMATOCRIT: 37.3 % (ref 36.0–46.0)
HEMOGLOBIN: 12.5 g/dL (ref 12.0–15.0)
MCH: 31.5 pg (ref 26.0–34.0)
MCHC: 33.5 g/dL (ref 30.0–36.0)
MCV: 94 fL (ref 78.0–100.0)
Platelets: 236 10*3/uL (ref 150–400)
RBC: 3.97 MIL/uL (ref 3.87–5.11)
RDW: 13 % (ref 11.5–15.5)
WBC: 7.8 10*3/uL (ref 4.0–10.5)

## 2016-04-01 LAB — URINE MICROSCOPIC-ADD ON: RBC / HPF: NONE SEEN RBC/hpf (ref 0–5)

## 2016-04-01 LAB — LIPASE, BLOOD: LIPASE: 33 U/L (ref 11–51)

## 2016-04-01 MED ORDER — SODIUM CHLORIDE 0.9 % IV BOLUS (SEPSIS)
1000.0000 mL | Freq: Once | INTRAVENOUS | Status: AC
  Administered 2016-04-02: 1000 mL via INTRAVENOUS

## 2016-04-01 MED ORDER — ONDANSETRON HCL 4 MG/2ML IJ SOLN
4.0000 mg | Freq: Once | INTRAMUSCULAR | Status: AC
  Administered 2016-04-02: 4 mg via INTRAVENOUS
  Filled 2016-04-01: qty 2

## 2016-04-01 MED ORDER — MORPHINE SULFATE (PF) 4 MG/ML IV SOLN
4.0000 mg | Freq: Once | INTRAVENOUS | Status: AC
  Administered 2016-04-02: 4 mg via INTRAVENOUS
  Filled 2016-04-01: qty 1

## 2016-04-01 NOTE — ED Triage Notes (Signed)
Pt c/o of lower abdominal pain that started about 2hrs ago. Pt has had 1 episode of emesis. Pt c/o mild dysuria.

## 2016-04-01 NOTE — ED Provider Notes (Signed)
[facility] DEPT Provider Note   CSN: [id number] Arrival date & time: 04/01/16  2109  By signing my name below, I, Higinio Plan, attest that this documentation has been prepared under the direction and in the presence of Blanchie Dessert, MD . Electronically Signed: Higinio Plan, Scribe. 04/01/2016. 11:55 PM.  History   Chief Complaint Chief Complaint  Patient presents with  . Abdominal Pain   The history is provided by the patient. No language interpreter was used.   HPI Comments: [name] is a 75 y.o. female who presents to the Emergency Department complaining of sudden onset, gradually improving, 8/10, lower abdominal pain that began at ~6:00 PM this evening. Pt reports associated nausea, 1 episode of vomiting and "pressure" on her bladder that began with the onset of her pain. She believes she ma have a bladder infection; though, she states recent infections have not occurred as suddenly as her current symptoms. She states she has had multiple, formed BMs today and denies diarrhea. Pt denies back pain, fever, foul odor or dark coloring of her urine, decreased appetite and taking any new medications.   Past Medical History:  Diagnosis Date  . Thyroid disease    Patient Active Problem List   Diagnosis Date Noted  . UNSPECIFIED HYPOTHYROIDISM 01/16/2009  . NAUSEA 01/16/2009  . DIARRHEA 01/16/2009    Past Surgical History:  Procedure Laterality Date  . COSMETIC SURGERY    . IRRIGATION AND DEBRIDEMENT OF WOUND WITH SPLIT THICKNESS SKIN GRAFT Bilateral 10/18/2012   Procedure: Evacuation of hematoma right face;  Surgeon: Renee Pain, MD;  Location: [facility];  Service: Plastics;  Laterality: Bilateral;  Removal of left face stitches    OB History    No data available       Home Medications    Prior to Admission medications   Medication Sig Start Date End Date Taking? Authorizing Provider  Cholecalciferol (VITAMIN D) 2000 UNITS CAPS Take by mouth 2 (two) times daily.     Historical Provider, MD  Cyanocobalamin (VITAMIN B12 PO) Take by mouth.    Historical Provider, MD  levothyroxine (SYNTHROID, LEVOTHROID) 50 MCG tablet Take 50 mcg by mouth daily before breakfast.    Historical Provider, MD  Loratadine (CLARITIN PO) Take by mouth. Reported on 07/26/2015    Historical Provider, MD  omega-3 acid ethyl esters (LOVAZA) 1 G capsule Take by mouth 2 (two) times daily.    Historical Provider, MD  Probiotic Product (PROBIOTIC DAILY PO) Take by mouth 2 (two) times daily.    Historical Provider, MD    Family History No family history on file.  Social History Social History  Substance Use Topics  . Smoking status: Never Smoker  . Smokeless tobacco: Not on file  . Alcohol use No   Allergies   Penicillins   Review of Systems Review of Systems  Constitutional: Negative for appetite change and fever.  Gastrointestinal: Positive for abdominal pain, nausea and vomiting. Negative for diarrhea.  Musculoskeletal: Negative for back pain.  All other systems reviewed and are negative.  Physical Exam Updated Vital Signs BP 176/89 (BP Location: Right Arm)   Pulse 84   Temp 98.2 F (36.8 C) (Oral)   Resp 15   Ht 5\' 2"  (1.575 m)   Wt 107 lb (48.5 kg)   SpO2 98%   BMI 19.57 kg/m   Physical Exam  Constitutional: She is oriented to person, place, and time. She appears well-developed and well-nourished. No distress.  HENT:  Head: Normocephalic  and atraumatic.  Eyes: EOM are normal.  Neck: Normal range of motion.  Cardiovascular: Normal rate, regular rhythm and normal heart sounds.   Pulmonary/Chest: Effort normal and breath sounds normal.  Abdominal: Soft. She exhibits no distension. There is tenderness. There is no rebound and no guarding.  Mild left flank tenderness and LLQ and suprapubic tenderness with no rebound or guarding  Musculoskeletal: Normal range of motion.  Neurological: She is alert and oriented to person, place, and time.  Skin: Skin is warm  and dry.  Psychiatric: She has a normal mood and affect. Judgment normal.  Nursing note and vitals reviewed.  ED Treatments / Results  Labs (all labs ordered are listed, but only abnormal results are displayed) Labs Reviewed  COMPREHENSIVE METABOLIC PANEL - Abnormal; Notable for the following:       Result Value   Glucose, Bld 140 (*)    BUN 28 (*)    Creatinine, Ser 1.04 (*)    GFR calc non Af Amer 51 (*)    GFR calc Af Amer 59 (*)    All other components within normal limits  URINALYSIS, ROUTINE W REFLEX MICROSCOPIC (NOT AT [facility]) - Abnormal; Notable for the following:    Leukocytes, UA TRACE (*)    All other components within normal limits  URINE MICROSCOPIC-ADD ON - Abnormal; Notable for the following:    Squamous Epithelial / LPF 0-5 (*)    Bacteria, UA RARE (*)    All other components within normal limits  LIPASE, BLOOD  CBC    EKG  EKG Interpretation None       Radiology Ct Renal Stone Study  Result Date: 04/02/2016 CLINICAL DATA:  LEFT-sided of abdominal and suprapubic pain beginning 2 hours ago. Single episode of emesis. Mild dysuria. EXAM: CT ABDOMEN AND PELVIS WITHOUT CONTRAST TECHNIQUE: Multidetector CT imaging of the abdomen and pelvis was performed following the standard protocol without IV contrast. COMPARISON:  None. FINDINGS: LOWER CHEST: LEFT lower lobe atelectasis/scarring. The visualized heart size is normal. No pericardial effusion. HEPATOBILIARY: Normal. PANCREAS: Normal. SPLEEN: Normal. ADRENALS/URINARY TRACT: Kidneys are orthotopic, demonstrating normal size and morphology. 2 mm LEFT interpolar nephrolithiasis mild LEFT hydroureteronephrosis, 2 mm LEFT ureterovesicular junction calculus. Mild nonspecific LEFT perinephric fat stranding. Assessment for renal masses on this nonenhanced examination. The unopacified ureters are normal in course and caliber. Urinary bladder is decompressed and unremarkable. Normal adrenal glands. STOMACH/BOWEL: The stomach, small  and large bowel are normal in course and caliber without inflammatory changes, the sensitivity may be decreased by lack of enteric contrast. Mild amount of retained large bowel stool. Mild sigmoid diverticulosis. Normal appendix. VASCULAR/LYMPHATIC: Aortoiliac vessels are normal in course and caliber, mild calcific atherosclerosis. No lymphadenopathy by CT size criteria. REPRODUCTIVE: Normal. OTHER: No intraperitoneal free fluid or free air. MUSCULOSKELETAL: Non-acute. Pectus excavatum. Grade 1 L4-5 anterolisthesis without spondylolysis. Multilevel mild to moderate facet arthropathy. IMPRESSION: 2 mm LEFT ureterovesicular junction calculus results in mild obstructive uropathy. Residual 2 mm LEFT interpolar nephrolithiasis. Electronically Signed   By: Elon Alas M.D.   On: 04/02/2016 01:22    Procedures Procedures (including critical care time)  Medications Ordered in ED Medications  morphine 4 MG/ML injection 4 mg (4 mg Intravenous Given 04/02/16 0024)  ondansetron (ZOFRAN) injection 4 mg (4 mg Intravenous Given 04/02/16 0024)  sodium chloride 0.9 % bolus 1,000 mL (0 mLs Intravenous Stopped 04/02/16 0205)  HYDROcodone-acetaminophen (NORCO/VICODIN) 5-325 MG per tablet 1 tablet (1 tablet Oral Given 04/02/16 0213)    DIAGNOSTIC STUDIES:  Oxygen Saturation is 98% on RA, normal by my interpretation.    COORDINATION OF CARE:  11:52 PM Discussed treatment plan with pt at bedside and pt agreed to plan.  Initial Impression / Assessment and Plan / ED Course  I have reviewed the triage vital signs and the nursing notes.  Pertinent labs & imaging results that were available during my care of the patient were reviewed by me and considered in my medical decision making (see chart for details).  Clinical Course    Pt with symptoms consistent with kidney stone.  Denies infectious sx, or GI symptoms.  Low concern for diverticulitis and no risk factors or history suggestive of AAA.  No hx suggestive  of GU source.  Will hydrate, treat pain and ensure no infection with UA, CBC, CMP and will get stone study to further eval. Labs without significant findings and CT shows renal stone.  After 1 dose of morphine pt is pain free.  Given strainer and f/u with urology   I personally performed the services described in this documentation, which was scribed in my presence. The recorded information has been reviewed and is accurate.   Final Clinical Impressions(s) / ED Diagnoses   Final diagnoses:  Kidney stone    New Prescriptions Discharge Medication List as of 04/02/2016  2:06 AM    START taking these medications   Details  HYDROcodone-acetaminophen (NORCO/VICODIN) 5-325 MG tablet Take 1 tablet by mouth every 6 (six) hours as needed., Starting Thu 04/02/2016, Print    ondansetron (ZOFRAN) 4 MG tablet Take 1 tablet (4 mg total) by mouth every 6 (six) hours., Starting Thu 04/02/2016, Print         Blanchie Dessert, MD 04/02/16 [phone]

## 2016-04-02 ENCOUNTER — Emergency Department (HOSPITAL_COMMUNITY): Payer: Medicare Other

## 2016-04-02 MED ORDER — HYDROCODONE-ACETAMINOPHEN 5-325 MG PO TABS: 1.0000 | ORAL_TABLET | Freq: Four times a day (QID) | ORAL | 0 refills | Status: DC | PRN

## 2016-04-02 MED ORDER — HYDROCODONE-ACETAMINOPHEN 5-325 MG PO TABS
1.0000 | ORAL_TABLET | Freq: Once | ORAL | Status: AC
  Administered 2016-04-02: 1 via ORAL
  Filled 2016-04-02: qty 1

## 2016-04-02 MED ORDER — ONDANSETRON HCL 4 MG PO TABS: 4.0000 mg | ORAL_TABLET | Freq: Four times a day (QID) | ORAL | 0 refills | Status: DC

## 2016-04-02 NOTE — ED Notes (Signed)
MD at bedside. 

## 2016-04-02 NOTE — ED Notes (Signed)
Pt in CT.

## 2017-08-06 DIAGNOSIS — N189 Chronic kidney disease, unspecified: Secondary | ICD-10-CM

## 2017-08-06 HISTORY — DX: Chronic kidney disease, unspecified: N18.9

## 2017-08-07 ENCOUNTER — Other Ambulatory Visit: Payer: Self-pay

## 2017-08-07 ENCOUNTER — Ambulatory Visit (HOSPITAL_COMMUNITY)
Admission: EM | Admit: 2017-08-07 | Discharge: 2017-08-07 | Disposition: A | Discharge status: Home / Self Care | Payer: Medicare Other | Attending: Emergency Medicine | Admitting: Emergency Medicine

## 2017-08-07 ENCOUNTER — Encounter (HOSPITAL_COMMUNITY): Payer: Self-pay | Admitting: *Deleted

## 2017-08-07 DIAGNOSIS — K5732 Diverticulitis of large intestine without perforation or abscess without bleeding: Secondary | ICD-10-CM | POA: Diagnosis not present

## 2017-08-07 LAB — POCT URINALYSIS DIP (DEVICE)
GLUCOSE, UA: NEGATIVE mg/dL
Ketones, ur: 15 mg/dL — AB
Nitrite: NEGATIVE
PH: 5 (ref 5.0–8.0)
Protein, ur: NEGATIVE mg/dL
UROBILINOGEN UA: 0.2 mg/dL (ref 0.0–1.0)

## 2017-08-07 MED ORDER — ONDANSETRON HCL 4 MG PO TABS: 4.0000 mg | ORAL_TABLET | Freq: Three times a day (TID) | ORAL | 0 refills | Status: DC | PRN

## 2017-08-07 MED ORDER — ONDANSETRON 4 MG PO TBDP
4.0000 mg | ORAL_TABLET | Freq: Once | ORAL | Status: AC
  Administered 2017-08-07: 4 mg via ORAL

## 2017-08-07 MED ORDER — METRONIDAZOLE 500 MG PO TABS: 500.0000 mg | ORAL_TABLET | Freq: Three times a day (TID) | ORAL | 0 refills | Status: DC

## 2017-08-07 MED ORDER — TRAMADOL HCL 50 MG PO TABS: 50.0000 mg | ORAL_TABLET | Freq: Four times a day (QID) | ORAL | 0 refills | Status: DC | PRN

## 2017-08-07 MED ORDER — ONDANSETRON 4 MG PO TBDP
ORAL_TABLET | ORAL | Status: AC
Start: 2017-08-07 — End: ?
  Filled 2017-08-07: qty 1

## 2017-08-07 MED ORDER — CIPROFLOXACIN HCL 500 MG PO TABS: 500.0000 mg | ORAL_TABLET | Freq: Two times a day (BID) | ORAL | 10 refills | Status: DC

## 2017-08-07 NOTE — ED Provider Notes (Signed)
[facility]    CSN: [id number] Arrival date & time: 08/07/17  1202     History   Chief Complaint Chief Complaint  Patient presents with  . Abdominal Pain    HPI [name] is a 77 y.o. female. The patient is presenting today with complaints of left lower quadrant abdominal pain 7/10 on scale that started yesterday evening.  Patient states she started off with an uncomfortable "pinching" feeling that started yesterday evening that she suspected was a "gas" initially. Patient states that the discomfort increased in severity starting at approximately 7 AM this morning which has resulted in nausea and vomiting 2. Patient states she has no significant medical history with her abdomen other than a history of one kidney stone. Patient states this does not feel similar to when she had a kidney stone. Patient notes that there was significant amount of mucus in her vomiting, but denies blood. Patient states she feels like she "has an infection". Patient states that she has had numerous diarrhea-like bowel movements this morning starting at approximately 8 AM, she reports chills but no fever.  Denies dark colored stools, blood or mucous present.   HPI  Past Medical History:  Diagnosis Date  . Thyroid disease     Patient Active Problem List   Diagnosis Date Noted  . UNSPECIFIED HYPOTHYROIDISM 01/16/2009  . NAUSEA 01/16/2009  . DIARRHEA 01/16/2009    Past Surgical History:  Procedure Laterality Date  . COSMETIC SURGERY    . IRRIGATION AND DEBRIDEMENT OF WOUND WITH SPLIT THICKNESS SKIN GRAFT Bilateral 10/18/2012   Procedure: Evacuation of hematoma right face;  Surgeon: Renee Pain, MD;  Location: [facility];  Service: Plastics;  Laterality: Bilateral;  Removal of left face stitches    OB History    No data available       Home Medications    Prior to Admission medications   Medication Sig Start Date End Date Taking? Authorizing Provider  Cholecalciferol (VITAMIN D)  2000 UNITS CAPS Take by mouth 2 (two) times daily.    [provider]  ciprofloxacin (CIPRO) 500 MG tablet Take 1 tablet (500 mg total) by mouth 2 (two) times daily. 08/07/17   Nehemiah Settle, NP  Cyanocobalamin (VITAMIN B12 PO) Take by mouth.    [provider]  HYDROcodone-acetaminophen (NORCO/VICODIN) 5-325 MG tablet Take 1 tablet by mouth every 6 (six) hours as needed. 04/02/16   Blanchie Dessert, MD  levothyroxine (SYNTHROID, LEVOTHROID) 50 MCG tablet Take 50 mcg by mouth daily before breakfast.    [provider]  Loratadine (CLARITIN PO) Take by mouth. Reported on 07/26/2015    [provider]  metroNIDAZOLE (FLAGYL) 500 MG tablet Take 1 tablet (500 mg total) by mouth 3 (three) times daily. 08/07/17   Nehemiah Settle, NP  omega-3 acid ethyl esters (LOVAZA) 1 G capsule Take by mouth 2 (two) times daily.    [provider]  ondansetron (ZOFRAN) 4 MG tablet Take 1 tablet (4 mg total) by mouth every 8 (eight) hours as needed for nausea or vomiting. 08/07/17   Nehemiah Settle, NP  Probiotic Product (PROBIOTIC DAILY PO) Take by mouth 2 (two) times daily.    [provider]  traMADol (ULTRAM) 50 MG tablet Take 1 tablet (50 mg total) by mouth every 6 (six) hours as needed. 08/07/17   Nehemiah Settle, NP    Family History History reviewed. No pertinent family history.  Social History Social History   Tobacco  Use  . Smoking status: Never Smoker  . Smokeless tobacco: Never Used  Substance Use Topics  . Alcohol use: No  . Drug use: No     Allergies   Penicillins   Review of Systems Review of Systems  Constitutional: Positive for chills. Negative for fever.  HENT: Negative.   Eyes: Negative.  Negative for visual disturbance.  Respiratory: Negative.  Negative for cough and shortness of breath.   Cardiovascular: Negative.  Negative for chest pain and leg swelling.  Gastrointestinal: Positive for abdominal pain, diarrhea, nausea  and vomiting. Negative for abdominal distention, anal bleeding, blood in stool, constipation and rectal pain.  Endocrine: Negative.   Genitourinary: Negative.  Negative for flank pain and hematuria.  Musculoskeletal: Negative.  Negative for gait problem and neck stiffness.  Skin: Negative.  Negative for rash.  Allergic/Immunologic: Negative.   Neurological: Negative.  Negative for dizziness and headaches.  Hematological: Negative.   Psychiatric/Behavioral: Negative.      Physical Exam Triage Vital Signs ED Triage Vitals  Enc Vitals Group     BP      Pulse      Resp      Temp      Temp src      SpO2      Weight      Height      Head Circumference      Peak Flow      Pain Score      Pain Loc      Pain Edu?      Excl. in [place]?    No data found.  Updated Vital Signs BP (!) 150/82 (BP Location: Right Arm)   Pulse 70   Temp (!) 97.4 F (36.3 C)    Physical Exam  Constitutional: She appears well-developed and well-nourished. No distress.  Eyes: Conjunctivae are normal. Pupils are equal, round, and reactive to light. Right eye exhibits no discharge. Left eye exhibits no discharge. No scleral icterus.  Neck: Normal range of motion. Neck supple. No thyromegaly present.  Cardiovascular: Normal rate, regular rhythm, normal heart sounds and intact distal pulses. Exam reveals no gallop and no friction rub.  No murmur heard. Pulmonary/Chest: Effort normal and breath sounds normal. No stridor. No respiratory distress. She has no wheezes. She has no rhonchi. She has no rales. She exhibits no tenderness.  Abdominal: Soft. Normal appearance. Bowel sounds are increased. There is no hepatosplenomegaly, splenomegaly or hepatomegaly. There is tenderness in the left lower quadrant. There is no rigidity, no rebound, no guarding, no CVA tenderness, no tenderness at McBurney's point and negative Murphy's sign.       Skin: She is not diaphoretic.  Nursing note and vitals reviewed.    UC  Treatments / Results  Labs (all labs ordered are listed, but only abnormal results are displayed) Labs Reviewed  POCT URINALYSIS DIP (DEVICE) - Abnormal; Notable for the following components:      Result Value   Bilirubin Urine SMALL (*)    Ketones, ur 15 (*)    Hgb urine dipstick MODERATE (*)    Leukocytes, UA SMALL (*)    All other components within normal limits    EKG  EKG Interpretation None       Radiology No results found.  Procedures Procedures (including critical care time)  Medications Ordered in UC Medications  ondansetron (ZOFRAN-ODT) disintegrating tablet 4 mg (4 mg Oral Given 08/07/17 1258)     Initial Impression / Assessment and Plan / UC Course  I have reviewed the triage vital signs and the nursing notes.  Pertinent labs & imaging results that were available during my care of the patient were reviewed by me and considered in my medical decision making (see chart for details).     She has some symptoms that present as a possible UTI, however patient denied any UTI symptoms and is non tender with palpation over bladder.  Patient is very tender in LLQ with hyperactive bowel sounds throughout.   Final Clinical Impressions(s) / UC Diagnoses   Final diagnoses:  Diverticulitis of colon    ED Discharge Orders        Ordered    ciprofloxacin (CIPRO) 500 MG tablet  2 times daily     08/07/17 1250    metroNIDAZOLE (FLAGYL) 500 MG tablet  3 times daily     08/07/17 1250    ondansetron (ZOFRAN) 4 MG tablet  Every 8 hours PRN     08/07/17 1300    traMADol (ULTRAM) 50 MG tablet  Every 6 hours PRN     08/07/17 1300       Controlled Substance Prescriptions [place] Controlled Substance Registry consulted? Yes, I have consulted the [place] Controlled Substances Registry for this patient, and feel the risk/benefit ratio today is favorable for proceeding with this prescription for a controlled substance. The usual and customary discharge instructions and warnings were  given.  The patient verbalizes understanding and agrees to plan of care.      Nehemiah Settle, NP 08/07/17 1319

## 2017-08-07 NOTE — ED Triage Notes (Addendum)
Lower abd pain, per pt gas as well, per pt abd pain started 7am this morning. Nasal drainage.

## 2017-08-07 NOTE — Discharge Instructions (Signed)
Your urine showed the presence of blood and white cells which does point towards a possible UTI.  The antibiotics you've been prescribed will treat infection.  Follow up with your primary care provider and go to the Emergency Department if the pain worsens or fails to improve.

## 2018-08-11 ENCOUNTER — Encounter: Payer: Self-pay | Admitting: Gastroenterology

## 2018-08-15 ENCOUNTER — Encounter: Payer: Self-pay | Admitting: Gastroenterology

## 2018-09-12 ENCOUNTER — Ambulatory Visit (AMBULATORY_SURGERY_CENTER): Payer: Self-pay | Admitting: *Deleted

## 2018-09-12 ENCOUNTER — Encounter: Payer: Self-pay | Admitting: Gastroenterology

## 2018-09-12 ENCOUNTER — Other Ambulatory Visit: Payer: Self-pay

## 2018-09-12 VITALS — Ht 60.0 in | Wt 121.0 lb

## 2018-09-12 DIAGNOSIS — Z86010 Personal history of colonic polyps: Secondary | ICD-10-CM

## 2018-09-12 MED ORDER — NA SULFATE-K SULFATE-MG SULF 17.5-3.13-1.6 GM/177ML PO SOLN: 1.0000 | Freq: Once | ORAL | 0 refills | Status: AC

## 2018-09-12 NOTE — Progress Notes (Signed)
No egg or soy allergy known to patient  No issues with past sedation with any surgeries  or procedures, no intubation problems  No diet pills per patient No home 02 use per patient  No blood thinners per patient  Pt denies issues with constipation  No A fib or A flutter  EMMI video sent to pt's e mail - declined   

## 2018-09-30 ENCOUNTER — Encounter: Payer: Medicare Other | Admitting: Gastroenterology

## 2018-11-25 ENCOUNTER — Telehealth: Payer: Self-pay | Admitting: Gastroenterology

## 2018-11-25 NOTE — Telephone Encounter (Signed)
Patient called to reschedule her procedure that was on 3-27 and was canceled due to [place]. I have scheduled the patient for 12-23-18 @ 10:00am. Can you please resent her the instructions. She still has her suprep

## 2018-11-25 NOTE — Telephone Encounter (Signed)
New instructions mailed to pt for her RS colon to 12-23-2018. 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  Instructions via My Chart and Mail as well.

## 2018-12-22 ENCOUNTER — Telehealth: Payer: Self-pay | Admitting: Gastroenterology

## 2018-12-22 NOTE — Telephone Encounter (Signed)

## 2018-12-23 ENCOUNTER — Encounter: Payer: Self-pay | Admitting: Gastroenterology

## 2018-12-23 ENCOUNTER — Ambulatory Visit (AMBULATORY_SURGERY_CENTER): Payer: Medicare Other | Admitting: Gastroenterology

## 2018-12-23 ENCOUNTER — Other Ambulatory Visit: Payer: Self-pay

## 2018-12-23 VITALS — BP 146/67 | HR 64 | Temp 97.5°F | Resp 14 | Ht 60.0 in | Wt 121.0 lb

## 2018-12-23 DIAGNOSIS — Z86010 Personal history of colonic polyps: Secondary | ICD-10-CM | POA: Diagnosis not present

## 2018-12-23 DIAGNOSIS — D123 Benign neoplasm of transverse colon: Secondary | ICD-10-CM

## 2018-12-23 MED ORDER — SODIUM CHLORIDE 0.9 % IV SOLN: 500.0000 mL | Freq: Once | INTRAVENOUS | Status: AC

## 2018-12-23 NOTE — Patient Instructions (Signed)
YOU HAD AN ENDOSCOPIC PROCEDURE TODAY AT [facility]:   Refer to the procedure report that was given to you for any specific questions about what was found during the examination.  If the procedure report does not answer your questions, please call your gastroenterologist to clarify.  If you requested that your care partner not be given the details of your procedure findings, then the procedure report has been included in a sealed envelope for you to review at your convenience later.  YOU SHOULD EXPECT: Some feelings of bloating in the abdomen. Passage of more gas than usual.  Walking can help get rid of the air that was put into your GI tract during the procedure and reduce the bloating. If you had a lower endoscopy (such as a colonoscopy or flexible sigmoidoscopy) you may notice spotting of blood in your stool or on the toilet paper. If you underwent a bowel prep for your procedure, you may not have a normal bowel movement for a few days.  Please Note:  You might notice some irritation and congestion in your nose or some drainage.  This is from the oxygen used during your procedure.  There is no need for concern and it should clear up in a day or so.  SYMPTOMS TO REPORT IMMEDIATELY:   Following lower endoscopy (colonoscopy or flexible sigmoidoscopy):  Excessive amounts of blood in the stool  Significant tenderness or worsening of abdominal pains  Swelling of the abdomen that is new, acute  Fever of 100F or higher  For urgent or emergent issues, a gastroenterologist can be reached at any hour by calling [phone].   DIET:  We do recommend a small meal at first, but then you may proceed to your regular diet.  Drink plenty of fluids but you should avoid alcoholic beverages for 24 hours.  ACTIVITY:  You should plan to take it easy for the rest of today and you should NOT DRIVE or use heavy machinery until tomorrow (because of the sedation medicines used during the test).     FOLLOW UP: Our staff will call the number listed on your records 48-72 hours following your procedure to check on you and address any questions or concerns that you may have regarding the information given to you following your procedure. If we do not reach you, we will leave a message.  We will attempt to reach you two times.  During this call, we will ask if you have developed any symptoms of COVID 19. If you develop any symptoms (ie: fever, flu-like symptoms, shortness of breath, cough etc.) before then, please call [phone].  If you test positive for Covid 19 in the 2 weeks post procedure, please call and report this information to [place].    If any biopsies were taken you will be contacted by phone or by letter within the next 1-3 weeks.  Please call us at [phone] if you have not heard about the biopsies in 3 weeks.    Await for biopsy results Polyps (handout given) Hemorrhoids (handout given) Diverticulosis (handout given)  SIGNATURES/CONFIDENTIALITY: You and/or your care partner have signed paperwork which will be entered into your electronic medical record.  These signatures attest to the fact that that the information above on your After Visit Summary has been reviewed and is understood.  Full responsibility of the confidentiality of this discharge information lies with you and/or your care-partner.

## 2018-12-23 NOTE — Op Note (Signed)
[facility] Patient Name: [name] Procedure Date: 12/23/2018 9:30 AM MRN: [id number] Endoscopist: Mauri Pole , MD Age: 78 Referring MD:  Date of Birth: [birth year] Gender: Female Account #: [id number] Procedure:                Colonoscopy Indications:              Surveillance: Personal history of adenomatous                            polyps on last colonoscopy 3 years ago, High risk                            colon cancer surveillance: Personal history of                            multiple (3 or more) adenomas Medicines:                Monitored Anesthesia Care Procedure:                Pre-Anesthesia Assessment:                           - Prior to the procedure, a History and Physical                            was performed, and patient medications and                            allergies were reviewed. The patient's tolerance of                            previous anesthesia was also reviewed. The risks                            and benefits of the procedure and the sedation                            options and risks were discussed with the patient.                            All questions were answered, and informed consent                            was obtained. Prior Anticoagulants: The patient has                            taken no previous anticoagulant or antiplatelet                            agents. ASA Grade Assessment: II - A patient with                            mild systemic disease. After reviewing the risks  and benefits, the patient was deemed in                            satisfactory condition to undergo the procedure.                           After obtaining informed consent, the colonoscope                            was passed under direct vision. Throughout the                            procedure, the patient's blood pressure, pulse, and                            oxygen saturations were  monitored continuously. The                            Colonoscope was introduced through the anus and                            advanced to the the cecum, identified by                            appendiceal orifice and ileocecal valve. The                            colonoscopy was performed without difficulty. The                            patient tolerated the procedure well. The quality                            of the bowel preparation was excellent. The                            ileocecal valve, appendiceal orifice, and rectum                            were photographed. Scope In: 9:39:16 AM Scope Out: 10:14:12 AM Scope Withdrawal Time: 0 hours 7 minutes 46 seconds  Total Procedure Duration: 0 hours 34 minutes 56 seconds  Findings:                 The perianal and digital rectal examinations were                            normal.                           A 4 mm polyp was found in the transverse colon. The                            polyp was sessile. The polyp was removed with a  cold snare. Resection and retrieval were complete.                           Two sessile polyps were found in the transverse                            colon. The polyps were 1 to 2 mm in size. These                            polyps were removed with a cold biopsy forceps.                            Resection and retrieval were complete.                           Scattered small-mouthed diverticula were found in                            the sigmoid colon and descending colon.                           Non-bleeding internal hemorrhoids were found during                            retroflexion. The hemorrhoids were small. Complications:            No immediate complications. Estimated Blood Loss:     Estimated blood loss was minimal. Impression:               - One 4 mm polyp in the transverse colon, removed                            with a cold snare. Resected and  retrieved.                           - Two 1 to 2 mm polyps in the transverse colon,                            removed with a cold biopsy forceps. Resected and                            retrieved.                           - Moderate diverticulosis in the sigmoid colon and                            in the descending colon.                           - Non-bleeding internal hemorrhoids. Recommendation:           - Patient has a contact number available for  emergencies. The signs and symptoms of potential                            delayed complications were discussed with the                            patient. Return to normal activities tomorrow.                            Written discharge instructions were provided to the                            patient.                           - Resume previous diet.                           - Continue present medications.                           - Await pathology results.                           - No repeat colonoscopy due to age. Mauri Pole, MD 12/23/2018 10:21:44 AM This report has been signed electronically.

## 2018-12-23 NOTE — Progress Notes (Signed)
Report to PACU, RN, vss, BBS= Clear.  

## 2018-12-23 NOTE — Progress Notes (Signed)
Called to room to assist during endoscopic procedure.  Patient ID and intended procedure confirmed with present staff. Received instructions for my participation in the procedure from the performing physician.  

## 2018-12-23 NOTE — Progress Notes (Signed)
Pt's states no medical or surgical changes since previsit or office visit.  Temps taken by CW V/S taken by [place]

## 2018-12-27 ENCOUNTER — Telehealth: Payer: Self-pay

## 2018-12-27 NOTE — Telephone Encounter (Signed)
  Follow up Call-  Call back number 12/23/2018  Post procedure Call Back phone  # [phone]  Permission to leave phone message Yes  Some recent data might be hidden     Patient questions:  Do you have a fever, pain , or abdominal swelling? No. Pain Score  0 *  Have you tolerated food without any problems? Yes.    Have you been able to return to your normal activities? Yes.    Do you have any questions about your discharge instructions: Diet   No. Medications  No. Follow up visit  No.  Do you have questions or concerns about your Care? No.  Actions: * If pain score is 4 or above: No action needed, pain <4.  1. Have you developed a fever since your procedure? no  2.   Have you had an respiratory symptoms (SOB or cough) since your procedure? no  3.   Have you tested positive for COVID 19 since your procedure no  4.   Have you had any family members/close contacts diagnosed with the COVID 19 since your procedure?  no   If yes to any of these questions please route to Joylene John, RN and Alphonsa Gin, Therapist, sports.

## 2019-01-05 ENCOUNTER — Encounter: Payer: Self-pay | Admitting: Gastroenterology

## 2019-07-26 ENCOUNTER — Ambulatory Visit: Payer: Medicare Other | Attending: Internal Medicine

## 2019-07-26 DIAGNOSIS — Z23 Encounter for immunization: Secondary | ICD-10-CM

## 2019-07-26 NOTE — Progress Notes (Signed)
   Covid-19 Vaccination Clinic  Name:  [name]    MRN: [id number] DOB: 1940-08-03  07/26/2019  [name] was observed post Covid-19 immunization for 15 minutes without incidence. She was provided with Vaccine Information Sheet and instruction to access the V-Safe system.   [name] was instructed to call 911 with any severe reactions post vaccine: Marland Kitchen Difficulty breathing  . Swelling of your face and throat  . A fast heartbeat  . A bad rash all over your body  . Dizziness and weakness    Immunizations Administered    Name Date Dose VIS Date Route   Pfizer COVID-19 Vaccine 07/26/2019 10:00 AM 0.3 mL 06/16/2019 Intramuscular   Manufacturer: [employer], [employer]   Lot: [id number]   [place]: [id number]

## 2019-07-26 NOTE — Progress Notes (Signed)
COVID-19 Vaccine Information can be found at: [email or web address] For questions related to vaccine distribution or appointments, please email [email or web address] or call [phone].    

## 2019-08-16 ENCOUNTER — Ambulatory Visit: Payer: Medicare Other | Attending: Internal Medicine

## 2019-08-16 DIAGNOSIS — Z23 Encounter for immunization: Secondary | ICD-10-CM | POA: Insufficient documentation

## 2019-08-16 NOTE — Progress Notes (Signed)
   Covid-19 Vaccination Clinic  Name:  [name]    MRN: [id number] DOB: April 09, 1941  08/16/2019  [name] was observed post Covid-19 immunization for 15 minutes without incidence. She was provided with Vaccine Information Sheet and instruction to access the V-Safe system.   [name] was instructed to call 911 with any severe reactions post vaccine: Marland Kitchen Difficulty breathing  . Swelling of your face and throat  . A fast heartbeat  . A bad rash all over your body  . Dizziness and weakness    Immunizations Administered    Name Date Dose VIS Date Route   Pfizer COVID-19 Vaccine 08/16/2019 12:31 PM 0.3 mL 06/16/2019 Intramuscular   Manufacturer: [place]   Lot: [id number]   [place]: [id number]

## 2020-05-03 ENCOUNTER — Other Ambulatory Visit: Payer: Self-pay | Admitting: Internal Medicine

## 2020-05-03 DIAGNOSIS — E785 Hyperlipidemia, unspecified: Secondary | ICD-10-CM

## 2020-05-20 ENCOUNTER — Ambulatory Visit
Admission: RE | Admit: 2020-05-20 | Discharge: 2020-05-20 | Disposition: A | Discharge status: Home / Self Care | Payer: Medicare Other | Source: Ambulatory Visit | Attending: Internal Medicine | Admitting: Internal Medicine

## 2020-05-20 DIAGNOSIS — E785 Hyperlipidemia, unspecified: Secondary | ICD-10-CM

## 2020-09-08 NOTE — Progress Notes (Signed)
Cardiology Office Note:    Date:  09/10/2020   ID:  [name], DOB September 29, 1940, MRN [id number]  PCP:  Crist Infante, [place]  Cardiologist:  No primary care provider on file.  Advanced Practice Provider:  No care team member to display Electrophysiologist:  None    Referring MD: Geoffery Lyons, NP     History of Present Illness:    [name] is a 80 y.o. female with a hx of hypothyroidism who was referred by [name] for urgent evaluation of recent syncope.  Patient was seen by [name], FNP on 08/26/20. Note reviewed. Per report, on the Sunday prior to her visit with her PCP, she was sitting reading and then stood up and syncopized.  She was on the floor when she came too. No head strike. She was wearing her apple watch that was asking her if she was okay as it detected a fall. No arrhythmias detected on the watch. Had a similar episode when washing dishes where she felt suddenly lightheaded but did not lose consciousness. Denies any anginal symptoms. She is active and walks daily 3-36miles without issues. Admits to not drinking much fluid throughout the day. Not on antihypertensives or beta blockers. No prior history of syncope.  Since that episode, she has had no further episodes of syncope. She remains active without issues. No chest pain, recurrent lightheadedness, palpitations, nausea or vomiting. She has significantly increased her fluid intake which she thinks has helped her symptoms  CT calcium score of 49.3 all in LAD (39% for subjects of the same age, gender and race/ethnicity). Was recommended for statin but did not start yet.  TC 222, TG 84, HDL 65, LDL 140, TSH normal 2.92, HgB 12.1, A1C 5.2  Past Medical History:  Diagnosis Date  . Allergy   . Cataract    bilaterally removed   . Chronic kidney disease 08/2017   seen in the ED- told diverticulitis but was truly kidney stones   . Osteopenia   . Thyroid disease     hypothyroid    Past Surgical History:  Procedure Laterality Date  . BLADDER SUSPENSION    . BREAST REDUCTION SURGERY    . COLONOSCOPY    . COSMETIC SURGERY    . IRRIGATION AND DEBRIDEMENT OF WOUND WITH SPLIT THICKNESS SKIN GRAFT Bilateral 10/18/2012   Procedure: Evacuation of hematoma right face;  Surgeon: Renee Pain, MD;  Location: [facility];  Service: Plastics;  Laterality: Bilateral;  Removal of left face stitches  . POLYPECTOMY      Current Medications: Current Meds  Medication Sig  . Calcium Citrate (CITRACAL PO) Take by mouth.  . Cholecalciferol (VITAMIN D) 2000 UNITS CAPS Take 4,000 Units by mouth daily.   . Coenzyme Q10 (COQ10 PO) Take by mouth.  . COLLAGEN PO Take by mouth.  . Cyanocobalamin (VITAMIN B12 PO) Take by mouth. 5000 mcg once a week  . Fluticasone Propionate 93 MCG/ACT EXHU Place into the nose.  . Levocetirizine Dihydrochloride (XYZAL PO) Take by mouth.  . levothyroxine (SYNTHROID, LEVOTHROID) 50 MCG tablet Take 50 mcg by mouth daily before breakfast.  . levothyroxine (SYNTHROID, LEVOTHROID) 75 MCG tablet   . loperamide (IMODIUM) 2 MG capsule Take by mouth.  Marland Kitchen MILK THISTLE PO Take by mouth. Taking 375 mg daily  . Misc Natural Products (BETA-SITOSTEROL PLANT STEROLS) CAPS Take by mouth.  . Multiple Minerals-Vitamins (CALCIUM-MAGNESIUM-ZINC-D3) TABS Take by mouth.  . omega-3 acid ethyl  esters (LOVAZA) 1 G capsule Take by mouth 2 (two) times daily.  . Probiotic Product (PROBIOTIC DAILY PO) Take by mouth 2 (two) times daily.  . rosuvastatin (CRESTOR) 5 MG tablet Take 1 tablet (5 mg total) by mouth daily.  . Triamcinolone Acetonide (NASACORT AQ NA) Place into the nose.  . TURMERIC CURCUMIN PO Take by mouth.  Marland Kitchen UNABLE TO FIND Lion's mane Mushroom 1/2 tsp/day   Current Facility-Administered Medications for the 09/10/20 encounter (Office Visit) with [name], MD  Medication  . 0.9 %  sodium chloride infusion     Allergies:   Penicillins   Social  History   Socioeconomic History  . Marital status: Married    Spouse name: Not on file  . Number of children: Not on file  . Years of education: Not on file  . Highest education level: Not on file  Occupational History  . Not on file  Tobacco Use  . Smoking status: Never Smoker  . Smokeless tobacco: Never Used  Vaping Use  . Vaping Use: Never used  Substance and Sexual Activity  . Alcohol use: Yes    Comment: occ glass of wine   . Drug use: No  . Sexual activity: Not on file  Other Topics Concern  . Not on file  Social History Narrative  . Not on file   Social Determinants of Health   Financial Resource Strain: Not on file  Food Insecurity: Not on file  Transportation Needs: Not on file  Physical Activity: Not on file  Stress: Not on file  Social Connections: Not on file     Family History: The patient's family history is negative for Colon cancer, Colon polyps, Esophageal cancer, Rectal cancer, and Stomach cancer.  ROS:   Please see the history of present illness.    Review of Systems  Constitutional: Negative for chills and fever.  HENT: Negative for hearing loss.   Eyes: Negative for blurred vision and redness.  Respiratory: Negative for shortness of breath.   Cardiovascular: Negative for chest pain, palpitations, orthopnea, claudication, leg swelling and PND.  Gastrointestinal: Negative for melena, nausea and vomiting.  Genitourinary: Negative for dysuria and flank pain.  Musculoskeletal: Negative for myalgias.  Neurological: Positive for dizziness and loss of consciousness. Negative for focal weakness and seizures.  Endo/Heme/Allergies: Negative for polydipsia.  Psychiatric/Behavioral: Negative for substance abuse.    EKGs/Labs/Other Studies Reviewed:    The following studies were reviewed today: CT calcium score 05/2020: FINDINGS: CORONARY CALCIUM SCORES:  Left Main: 0  LAD: 49.3  LCx: 0  RCA: 0  Total Agatston Score: 49.3  MESA  database percentile: 39  AORTA MEASUREMENTS:  Ascending Aorta: 31 mm  Descending Aorta: 20 mm  OTHER FINDINGS:  Heart is normal size. Calcifications in the aortic root and descending thoracic aorta. Calcified mediastinal and left hilar lymph nodes. Calcified granuloma in the left lower lobe. Linear scarring in the lingula and left lower lobe at the lung base. No effusions. Imaging into the upper abdomen demonstrates no acute findings. Chest wall soft tissues are unremarkable. No acute bony abnormality.  IMPRESSION: The observed calcium score of 49.3 is at the percentile 39 for subjects of the same age, gender and race/ethnicity who are free of clinical cardiovascular disease and treated diabetes.  No acute extra cardiac abnormality.   Recent Labs: No results found for requested labs within last 8760 hours.  Recent Lipid Panel No results found for: CHOL, TRIG, HDL, CHOLHDL, VLDL, LDLCALC, LDLDIRECT  Risk Assessment/Calculations:       Physical Exam:    VS:  BP 120/80   Pulse 74   Ht 5' 1.5" (1.562 m)   Wt 113 lb (51.3 kg)   SpO2 98%   BMI 21.01 kg/m     Wt Readings from Last 3 Encounters:  09/10/20 113 lb (51.3 kg)  12/23/18 121 lb (54.9 kg)  09/12/18 121 lb (54.9 kg)     GEN:  Well nourished, well developed in no acute distress HEENT: Normal NECK: No JVD; No carotid bruits LYMPHATICS: No lymphadenopathy CARDIAC: RRR, no murmurs, rubs, gallops RESPIRATORY:  Clear to auscultation without rales, wheezing or rhonchi  ABDOMEN: Soft, non-tender, non-distended MUSCULOSKELETAL:  No edema; No deformity  SKIN: Warm and dry NEUROLOGIC:  Alert and oriented x 3 PSYCHIATRIC:  Normal affect   ASSESSMENT:    1. Syncope and collapse   2. Coronary artery disease involving native coronary artery of native heart without angina pectoris   3. Mixed hyperlipidemia    PLAN:    In order of problems listed above:  #Syncope: Patient with episode of suspected  orthostatic syncope where she lost consciousness after going from a seated to a standing position. No preceding palpitations, chest pain or shortness of breath. Apple watch negative for any detected arrhythmias, although did detect her fall. No headstrike. Notably, had felt lightheaded that week after being dehydrated. Not on any antihypertensives or nodal agent.s No known history of arrhythmias. No prior syncopal episodes. No history of seizures. Coronary calcium score 49 with no known obstructive CAD.  -Check 7 day cardiac monitor -Counseled about maintaining adequate hydration with both water and gatorade/powerade zero -Not on any antihypertensives or beta blockers -Compression socks/abdominal binders as tolerated -Advised more liberal salt intake to prevent recurrence -If patient feels faint, encouraged her to lay down and put her legs up -Slow position changes  #Coronary calcification: Calcium score 49.3 all in LAD (39% for subjects of the same age, gender and race/ethnicity). Patient hesitant to take statin but discussed the benefits in her at length. She is willing to do a trial of a low dose crestor.  -Agreed to crestor 5mg  daily with goal LDL<70 -Continue lifestyle modifications with healthy diet and exercise as detailed below  #HLD: -Crestor as above -Repeat lipid panel in 6weeks on the crestor -Goal LDL<70  Exercise recommendations: Goal of exercising for at least 30 minutes a day, at least 5 times per week.  Please exercise to a moderate exertion.  This means that while exercising it is difficult to speak in full sentences, however you are not so short of breath that you feel you must stop, and not so comfortable that you can carry on a full conversation.  Exertion level should be approximately a 5/10, if 10 is the most exertion you can perform.  Diet recommendations: Recommend a heart healthy diet such as the Mediterranean diet.  This diet consists of plant based foods, healthy  fats, lean meats, olive oil.  It suggests limiting the intake of simple carbohydrates such as white breads, pastries, and pastas.  It also limits the amount of red meat, wine, and dairy products such as cheese that one should consume on a daily basis.  Medication Adjustments/Labs and Tests Ordered: Current medicines are reviewed at length with the patient today.  Concerns regarding medicines are outlined above.  Orders Placed This Encounter  Procedures  . Lipid panel  . LONG TERM MONITOR (3-14 DAYS)   Meds ordered this encounter  Medications  . rosuvastatin (CRESTOR) 5 MG tablet    Sig: Take 1 tablet (5 mg total) by mouth daily.    Dispense:  90 tablet    Refill:  3    Patient Instructions   Medication Instructions:  Patient to start Crestor 5mg  daily Your physician recommends that you continue on your current medications as directed. Please refer to the Current Medication list given to you today. *If you need a refill on your cardiac medications before your next appointment, please call your pharmacy*   Lab Work:  Lipids in 6 weeks If you have labs (blood work) drawn today and your tests are completely normal, you will receive your results only by: Marland Kitchen MyChart Message (if you have MyChart) OR . A paper copy in the mail If you have any lab test that is abnormal or we need to change your treatment, we will call you to review the results.   Testing/Procedures: [facility]- Long Term Monitor Instructions   Your physician has requested you wear your ZIO patch monitor_______days.   This is a single patch monitor.  [employer] supplies one patch monitor per enrollment.  Additional stickers are not available.   Please do not apply patch if you will be having a Nuclear Stress Test, Echocardiogram, Cardiac CT, MRI, or Chest Xray during the time frame you would be wearing the monitor. The patch cannot be worn during these tests.  You cannot remove and re-apply the ZIO XT patch monitor.   Your  ZIO patch monitor will be sent USPS Priority mail from [facility] directly to your home address. The monitor may also be mailed to a PO BOX if home delivery is not available.   It may take 3-5 days to receive your monitor after you have been enrolled.   Once you have received you monitor, please review enclosed instructions.  Your monitor has already been registered assigning a specific monitor serial # to you.   Applying the monitor   Shave hair from upper left chest.   Hold abrader disc by orange tab.  Rub abrader in 40 strokes over left upper chest as indicated in your monitor instructions.   Clean area with 4 enclosed alcohol pads .  Use all pads to assure are is cleaned thoroughly.  Let dry.   Apply patch as indicated in monitor instructions.  Patch will be place under collarbone on left side of chest with arrow pointing upward.   Rub patch adhesive wings for 2 minutes.Remove white label marked "1".  Remove white label marked "2".  Rub patch adhesive wings for 2 additional minutes.   While looking in a mirror, press and release button in center of patch.  A small green light will flash 3-4 times .  This will be your only indicator the monitor has been turned on.     Do not shower for the first 24 hours.  You may shower after the first 24 hours.   Press button if you feel a symptom. You will hear a small click.  Record Date, Time and Symptom in the Patient Log Book.   When you are ready to remove patch, follow instructions on last 2 pages of Patient Log Book.  Stick patch monitor onto last page of Patient Log Book.   Place Patient Log Book in [place] box.  Use locking tab on box and tape box closed securely.  The [employer] has [employer] on it.  Please place in mailbox as soon  as possible.  Your physician should have your test results approximately 7 days after the monitor has been mailed back to [facility].   Call [place] at [phone]  if you have questions regarding your ZIO XT patch monitor.  Call them immediately if you see an orange light blinking on your monitor.   If your monitor falls off in less than 4 days contact our Monitor department at [phone].  If your monitor becomes loose or falls off after 4 days call [employer] at [phone] for suggestions on securing your monitor.      Orthostatic Hypotension Blood pressure is a measurement of how strongly, or weakly, your blood is pressing against the walls of your arteries. Orthostatic hypotension is a sudden drop in blood pressure that happens when you quickly change positions, such as when you get up from sitting or lying down. Arteries are blood vessels that carry blood from your heart throughout your body. When blood pressure is too low, you may not get enough blood to your brain or to the rest of your organs. This can cause weakness, light-headedness, rapid heartbeat, and fainting. This can last for just a few seconds or for up to a few minutes. Orthostatic hypotension is usually not a serious problem. However, if it happens frequently or gets worse, it may be a sign of something more serious. What are the causes? This condition may be caused by:  Sudden changes in posture, such as standing up quickly after you have been sitting or lying down.  Blood loss.  Loss of body fluids (dehydration).  Heart problems.  Hormone (endocrine) problems.  Pregnancy.  Severe infection.  Lack of certain nutrients.  Severe allergic reactions (anaphylaxis).  Certain medicines, such as blood pressure medicine or medicines that make the body lose excess fluids (diuretics). Sometimes, this condition can be caused by not taking medicine as directed, such as taking too much of a certain medicine. What increases the risk? The following factors may make you more likely to develop this condition:  Age. Risk increases as you get older.  Conditions that affect the heart or  the central nervous system.  Taking certain medicines, such as blood pressure medicine or diuretics.  Being pregnant. What are the signs or symptoms? Symptoms of this condition may include:  Weakness.  Light-headedness.  Dizziness.  Blurred vision.  Fatigue.  Rapid heartbeat.  Fainting, in severe cases. How is this diagnosed? This condition is diagnosed based on:  Your medical history.  Your symptoms.  Your blood pressure measurement. Your health care provider will check your blood pressure when you are: ? Lying down. ? Sitting. ? Standing. A blood pressure reading is recorded as two numbers, such as "120 over 80" (or 120/80). The first ("top") number is called the systolic pressure. It is a measure of the pressure in your arteries as your heart beats. The second ("bottom") number is called the diastolic pressure. It is a measure of the pressure in your arteries when your heart relaxes between beats. Blood pressure is measured in a unit called mm Hg. Healthy blood pressure for most adults is 120/80. If your blood pressure is below 90/60, you may be diagnosed with hypotension. Other information or tests that may be used to diagnose orthostatic hypotension include:  Your other vital signs, such as your heart rate and temperature.  Blood tests.  Tilt table test. For this test, you will be safely secured to a table that moves you from a lying position to  an upright position. Your heart rhythm and blood pressure will be monitored during the test. How is this treated? This condition may be treated by:  Changing your diet. This may involve eating more salt (sodium) or drinking more water.  Taking medicines to raise your blood pressure.  Changing the dosage of certain medicines you are taking that might be lowering your blood pressure.  Wearing compression stockings. These stockings help to prevent blood clots and reduce swelling in your legs. In some cases, you may need to  go to the hospital for:  Fluid replacement. This means you will receive fluids through an IV.  Blood replacement. This means you will receive donated blood through an IV (transfusion).  Treating an infection or heart problems, if this applies.  Monitoring. You may need to be monitored while medicines that you are taking wear off. Follow these instructions at home: Eating and drinking  Drink enough fluid to keep your urine pale yellow.  Eat a healthy diet, and follow instructions from your health care provider about eating or drinking restrictions. A healthy diet includes: ? Fresh fruits and vegetables. ? Whole grains. ? Lean meats. ? Low-fat dairy products.  Eat extra salt only as directed. Do not add extra salt to your diet unless your health care provider told you to do that.  Eat frequent, small meals.  Avoid standing up suddenly after eating.   Medicines  Take over-the-counter and prescription medicines only as told by your health care provider. ? Follow instructions from your health care provider about changing the dosage of your current medicines, if this applies. ? Do not stop or adjust any of your medicines on your own. General instructions  Wear compression stockings as told by your health care provider.  Get up slowly from lying down or sitting positions. This gives your blood pressure a chance to adjust.  Avoid hot showers and excessive heat as directed by your health care provider.  Return to your normal activities as told by your health care provider. Ask your health care provider what activities are safe for you.  Do not use any products that contain nicotine or tobacco, such as cigarettes, e-cigarettes, and chewing tobacco. If you need help quitting, ask your health care provider.  Keep all follow-up visits as told by your health care provider. This is important.   Contact a health care provider if you:  Vomit.  Have diarrhea.  Have a fever for more  than 2-3 days.  Feel more thirsty than usual.  Feel weak and tired. Get help right away if you:  Have chest pain.  Have a fast or irregular heartbeat.  Develop numbness in any part of your body.  Cannot move your arms or your legs.  Have trouble speaking.  Become sweaty or feel light-headed.  Faint.  Feel short of breath.  Have trouble staying awake.  Feel confused. Summary  Orthostatic hypotension is a sudden drop in blood pressure that happens when you quickly change positions.  Orthostatic hypotension is usually not a serious problem.  It is diagnosed by having your blood pressure taken lying down, sitting, and then standing.  It may be treated by changing your diet or adjusting your medicines. This information is not intended to replace advice given to you by your health care provider. Make sure you discuss any questions you have with your health care provider. Document Revised: 12/16/2017 Document Reviewed: 12/16/2017 Elsevier Patient Education  2021 [place]: At [facility], you and  your health needs are our priority.  As part of our continuing mission to provide you with exceptional heart care, we have created designated Provider Care Teams.  These Care Teams include your primary Cardiologist (physician) and Advanced Practice Providers (APPs -  Physician Assistants and Nurse Practitioners) who all work together to provide you with the care you need, when you need it.  We recommend signing up for the patient portal called "MyChart".  Sign up information is provided on this After Visit Summary.  MyChart is used to connect with patients for Virtual Visits (Telemedicine).  Patients are able to view lab/test results, encounter notes, upcoming appointments, etc.  Non-urgent messages can be sent to your provider as well.   To learn more about what you can do with MyChart, go to [email or web address].    Your next appointment:   6 month(s)  The  format for your next appointment:   In Person  Provider:   You may see Dr. Johney Frame  or one of the following Advanced Practice Providers on your designated Care Team:    Richardson Dopp, PA-C  Robbie Lis, [place]        Signed, [name], MD  09/10/2020 10:17 AM    [place]

## 2020-09-10 ENCOUNTER — Encounter: Payer: Self-pay | Admitting: Cardiology

## 2020-09-10 ENCOUNTER — Ambulatory Visit: Payer: Medicare Other | Admitting: Cardiology

## 2020-09-10 ENCOUNTER — Encounter: Payer: Self-pay | Admitting: *Deleted

## 2020-09-10 ENCOUNTER — Other Ambulatory Visit: Payer: Self-pay

## 2020-09-10 ENCOUNTER — Ambulatory Visit (INDEPENDENT_AMBULATORY_CARE_PROVIDER_SITE_OTHER): Payer: Medicare Other

## 2020-09-10 VITALS — BP 120/80 | HR 74 | Ht 61.5 in | Wt 113.0 lb

## 2020-09-10 DIAGNOSIS — R55 Syncope and collapse: Secondary | ICD-10-CM

## 2020-09-10 DIAGNOSIS — I2510 Atherosclerotic heart disease of native coronary artery without angina pectoris: Secondary | ICD-10-CM | POA: Diagnosis not present

## 2020-09-10 DIAGNOSIS — E782 Mixed hyperlipidemia: Secondary | ICD-10-CM

## 2020-09-10 MED ORDER — ROSUVASTATIN CALCIUM 5 MG PO TABS: 5.0000 mg | ORAL_TABLET | Freq: Every day | ORAL | 3 refills | Status: DC

## 2020-09-10 NOTE — Patient Instructions (Signed)
Medication Instructions:  Patient to start Crestor 5mg  daily Your physician recommends that you continue on your current medications as directed. Please refer to the Current Medication list given to you today. *If you need a refill on your cardiac medications before your next appointment, please call your pharmacy*   Lab Work:  Lipids in 6 weeks If you have labs (blood work) drawn today and your tests are completely normal, you will receive your results only by: Marland Kitchen MyChart Message (if you have MyChart) OR . A paper copy in the mail If you have any lab test that is abnormal or we need to change your treatment, we will call you to review the results.   Testing/Procedures: Bryn Gulling- Long Term Monitor Instructions   Your physician has requested you wear your ZIO patch monitor_______days.   This is a single patch monitor.  [employer] supplies one patch monitor per enrollment.  Additional stickers are not available.   Please do not apply patch if you will be having a Nuclear Stress Test, Echocardiogram, Cardiac CT, MRI, or Chest Xray during the time frame you would be wearing the monitor. The patch cannot be worn during these tests.  You cannot remove and re-apply the ZIO XT patch monitor.   Your ZIO patch monitor will be sent USPS Priority mail from [facility] directly to your home address. The monitor may also be mailed to a PO BOX if home delivery is not available.   It may take 3-5 days to receive your monitor after you have been enrolled.   Once you have received you monitor, please review enclosed instructions.  Your monitor has already been registered assigning a specific monitor serial # to you.   Applying the monitor   Shave hair from upper left chest.   Hold abrader disc by orange tab.  Rub abrader in 40 strokes over left upper chest as indicated in your monitor instructions.   Clean area with 4 enclosed alcohol pads .  Use all pads to assure are is cleaned thoroughly.  Let  dry.   Apply patch as indicated in monitor instructions.  Patch will be place under collarbone on left side of chest with arrow pointing upward.   Rub patch adhesive wings for 2 minutes.Remove white label marked "1".  Remove white label marked "2".  Rub patch adhesive wings for 2 additional minutes.   While looking in a mirror, press and release button in center of patch.  A small green light will flash 3-4 times .  This will be your only indicator the monitor has been turned on.     Do not shower for the first 24 hours.  You may shower after the first 24 hours.   Press button if you feel a symptom. You will hear a small click.  Record Date, Time and Symptom in the Patient Log Book.   When you are ready to remove patch, follow instructions on last 2 pages of Patient Log Book.  Stick patch monitor onto last page of Patient Log Book.   Place Patient Log Book in [place] box.  Use locking tab on box and tape box closed securely.  The [employer] has [employer] on it.  Please place in mailbox as soon as possible.  Your physician should have your test results approximately 7 days after the monitor has been mailed back to [facility].   Call [place] at [phone] if you have questions regarding your ZIO XT patch monitor.  Call them immediately if you see an orange light blinking on your monitor.   If your monitor falls off in less than 4 days contact our Monitor department at [phone].  If your monitor becomes loose or falls off after 4 days call [employer] at [phone] for suggestions on securing your monitor.      Orthostatic Hypotension Blood pressure is a measurement of how strongly, or weakly, your blood is pressing against the walls of your arteries. Orthostatic hypotension is a sudden drop in blood pressure that happens when you quickly change positions, such as when you get up from sitting or lying down. Arteries are blood vessels that carry blood  from your heart throughout your body. When blood pressure is too low, you may not get enough blood to your brain or to the rest of your organs. This can cause weakness, light-headedness, rapid heartbeat, and fainting. This can last for just a few seconds or for up to a few minutes. Orthostatic hypotension is usually not a serious problem. However, if it happens frequently or gets worse, it may be a sign of something more serious. What are the causes? This condition may be caused by:  Sudden changes in posture, such as standing up quickly after you have been sitting or lying down.  Blood loss.  Loss of body fluids (dehydration).  Heart problems.  Hormone (endocrine) problems.  Pregnancy.  Severe infection.  Lack of certain nutrients.  Severe allergic reactions (anaphylaxis).  Certain medicines, such as blood pressure medicine or medicines that make the body lose excess fluids (diuretics). Sometimes, this condition can be caused by not taking medicine as directed, such as taking too much of a certain medicine. What increases the risk? The following factors may make you more likely to develop this condition:  Age. Risk increases as you get older.  Conditions that affect the heart or the central nervous system.  Taking certain medicines, such as blood pressure medicine or diuretics.  Being pregnant. What are the signs or symptoms? Symptoms of this condition may include:  Weakness.  Light-headedness.  Dizziness.  Blurred vision.  Fatigue.  Rapid heartbeat.  Fainting, in severe cases. How is this diagnosed? This condition is diagnosed based on:  Your medical history.  Your symptoms.  Your blood pressure measurement. Your health care provider will check your blood pressure when you are: ? Lying down. ? Sitting. ? Standing. A blood pressure reading is recorded as two numbers, such as "120 over 80" (or 120/80). The first ("top") number is called the systolic  pressure. It is a measure of the pressure in your arteries as your heart beats. The second ("bottom") number is called the diastolic pressure. It is a measure of the pressure in your arteries when your heart relaxes between beats. Blood pressure is measured in a unit called mm Hg. Healthy blood pressure for most adults is 120/80. If your blood pressure is below 90/60, you may be diagnosed with hypotension. Other information or tests that may be used to diagnose orthostatic hypotension include:  Your other vital signs, such as your heart rate and temperature.  Blood tests.  Tilt table test. For this test, you will be safely secured to a table that moves you from a lying position to an upright position. Your heart rhythm and blood pressure will be monitored during the test. How is this treated? This condition may be treated by:  Changing your diet. This may involve eating more salt (sodium) or drinking more water.  Taking  medicines to raise your blood pressure.  Changing the dosage of certain medicines you are taking that might be lowering your blood pressure.  Wearing compression stockings. These stockings help to prevent blood clots and reduce swelling in your legs. In some cases, you may need to go to the hospital for:  Fluid replacement. This means you will receive fluids through an IV.  Blood replacement. This means you will receive donated blood through an IV (transfusion).  Treating an infection or heart problems, if this applies.  Monitoring. You may need to be monitored while medicines that you are taking wear off. Follow these instructions at home: Eating and drinking  Drink enough fluid to keep your urine pale yellow.  Eat a healthy diet, and follow instructions from your health care provider about eating or drinking restrictions. A healthy diet includes: ? Fresh fruits and vegetables. ? Whole grains. ? Lean meats. ? Low-fat dairy products.  Eat extra salt only as  directed. Do not add extra salt to your diet unless your health care provider told you to do that.  Eat frequent, small meals.  Avoid standing up suddenly after eating.   Medicines  Take over-the-counter and prescription medicines only as told by your health care provider. ? Follow instructions from your health care provider about changing the dosage of your current medicines, if this applies. ? Do not stop or adjust any of your medicines on your own. General instructions  Wear compression stockings as told by your health care provider.  Get up slowly from lying down or sitting positions. This gives your blood pressure a chance to adjust.  Avoid hot showers and excessive heat as directed by your health care provider.  Return to your normal activities as told by your health care provider. Ask your health care provider what activities are safe for you.  Do not use any products that contain nicotine or tobacco, such as cigarettes, e-cigarettes, and chewing tobacco. If you need help quitting, ask your health care provider.  Keep all follow-up visits as told by your health care provider. This is important.   Contact a health care provider if you:  Vomit.  Have diarrhea.  Have a fever for more than 2-3 days.  Feel more thirsty than usual.  Feel weak and tired. Get help right away if you:  Have chest pain.  Have a fast or irregular heartbeat.  Develop numbness in any part of your body.  Cannot move your arms or your legs.  Have trouble speaking.  Become sweaty or feel light-headed.  Faint.  Feel short of breath.  Have trouble staying awake.  Feel confused. Summary  Orthostatic hypotension is a sudden drop in blood pressure that happens when you quickly change positions.  Orthostatic hypotension is usually not a serious problem.  It is diagnosed by having your blood pressure taken lying down, sitting, and then standing.  It may be treated by changing your diet  or adjusting your medicines. This information is not intended to replace advice given to you by your health care provider. Make sure you discuss any questions you have with your health care provider. Document Revised: 12/16/2017 Document Reviewed: 12/16/2017 Elsevier Patient Education  2021 [place]: At [facility], you and your health needs are our priority.  As part of our continuing mission to provide you with exceptional heart care, we have created designated Provider Care Teams.  These Care Teams include your primary Cardiologist (physician) and Advanced Practice Providers (APPs -  Physician Assistants and Nurse Practitioners) who all work together to provide you with the care you need, when you need it.  We recommend signing up for the patient portal called "MyChart".  Sign up information is provided on this After Visit Summary.  MyChart is used to connect with patients for Virtual Visits (Telemedicine).  Patients are able to view lab/test results, encounter notes, upcoming appointments, etc.  Non-urgent messages can be sent to your provider as well.   To learn more about what you can do with MyChart, go to [email or web address].    Your next appointment:   6 month(s)  The format for your next appointment:   In Person  Provider:   You may see Dr. Johney Frame  or one of the following Advanced Practice Providers on your designated Care Team:    Richardson Dopp, PA-C  [place], [place]

## 2020-09-10 NOTE — Progress Notes (Signed)
Patient ID: [name], female   DOB: Feb 02, 1941, 80 y.o.   MRN: [id number] Patient enrolled for [employer] to ship a 7 day ZIO XT long term holter monitor to her home.

## 2020-09-12 DIAGNOSIS — R55 Syncope and collapse: Secondary | ICD-10-CM | POA: Diagnosis not present

## 2020-10-22 ENCOUNTER — Other Ambulatory Visit: Payer: Self-pay

## 2020-10-22 ENCOUNTER — Other Ambulatory Visit: Payer: Medicare Other | Admitting: *Deleted

## 2020-10-22 DIAGNOSIS — E782 Mixed hyperlipidemia: Secondary | ICD-10-CM

## 2020-10-22 LAB — LIPID PANEL
Chol/HDL Ratio: 1.9 ratio (ref 0.0–4.4)
Cholesterol, Total: 150 mg/dL (ref 100–199)
HDL: 77 mg/dL (ref >39)
LDL Chol Calc (NIH): 60 mg/dL (ref 0–99)
Triglycerides: 63 mg/dL (ref 0–149)
VLDL Cholesterol Cal: 13 mg/dL (ref 5–40)

## 2020-11-01 ENCOUNTER — Telehealth: Payer: Self-pay | Admitting: Cardiology

## 2020-11-01 DIAGNOSIS — R002 Palpitations: Secondary | ICD-10-CM

## 2020-11-01 DIAGNOSIS — I499 Cardiac arrhythmia, unspecified: Secondary | ICD-10-CM

## 2020-11-01 DIAGNOSIS — I4891 Unspecified atrial fibrillation: Secondary | ICD-10-CM

## 2020-11-01 NOTE — Telephone Encounter (Signed)
Spoke with patient about alarms she is receiving from her Apple watch stating she is in Afib.  Patient most recent episode was on 4/26.  Patient advised to upload to [place] and she said she knows how and will do it.

## 2020-11-01 NOTE — Telephone Encounter (Signed)
Communication today in [place] encounter with patient indicated she is unable to upload ekg from [place] that show atrial fibrillation.  Will send message to Dr. Johney Frame for review and further recommendations.

## 2020-11-01 NOTE — Telephone Encounter (Signed)
New message    Talk to the nurse about patient is getting AFIB notices on her apple watch.  Should she be concerned?

## 2020-11-04 ENCOUNTER — Ambulatory Visit (INDEPENDENT_AMBULATORY_CARE_PROVIDER_SITE_OTHER): Payer: Medicare Other

## 2020-11-04 DIAGNOSIS — I4891 Unspecified atrial fibrillation: Secondary | ICD-10-CM

## 2020-11-04 DIAGNOSIS — I499 Cardiac arrhythmia, unspecified: Secondary | ICD-10-CM

## 2020-11-04 DIAGNOSIS — R002 Palpitations: Secondary | ICD-10-CM

## 2020-11-04 NOTE — Telephone Encounter (Signed)
RE: 2 week zio per Johney Frame Received: Today Bowman, Mauri Reading, LPN Monitor mailed :-)

## 2020-11-04 NOTE — Telephone Encounter (Signed)
Pt aware of plan and agrees with this.  °

## 2020-11-04 NOTE — Telephone Encounter (Signed)
You routed conversation to You 4 minutes ago (12:30 PM)   You 4 minutes ago (12:30 PM)       [name], MD at 11/04/2020 11:51 AM  Status: Signed    I think a zio is a great idea. Can we send her a 2 week zio patch to see if we can capture the irregular rhythm that was detected on her watch. Thank you!       Mychart message sent to the pt endorsing to her that Dr. Johney Frame would like for her to get a 2 week zio, to further evaluate if she is truly having afib or not, as indicated on her apple watch. Pt is aware that I will place the order for the zio in the system and send our monitor techs a message to enroll her and mail this to her address on file.  Zio instructions were sent to the pts mychart account as well. She was advised to mychart back or call the office with any additional questions or concerns, regarding this plan.       Documentation    You  Cydni, Reddoch "Becky" 8 minutes ago (12:26 PM)      Hey there, hope you are well.  So I spoke with Dr. Johney Frame and this is what she wants to do.  We have ordered for you to get a 2 week zio patch, which is our monitor/patch, that will be able to accurately assess if you are really having afib or not.  It is a patch that you will wear for 2 weeks.  We will mail this to your address on file.  You will place the patch on, wear this consecutively for 2 weeks, take this off, and put it back in the box it came in and mail back to the company to generate the report and send this to Dr. Johney Frame to further review thereafter.  I have already placed the order in the system for you to get this monitor.  What will happen next is our monitor techs Darrick Penna and Joellen Jersey, will enroll you into the monitor program we use, and mail this pt to your home.  Below are the instructions about the monitor, what you can do while wearing it, and what to expect with it.  Our Monitor Techs will be in contact with you soon to have all this  coordinated.  Please let me know you got this message and understand our recommendations.  Hope this is helpful, and this will definitely help [place] get to the bottom of what rhythm you are experiencing when you see this on your watch.  Thanks, Oprah Camarena Dr. Jacolyn Reedy Nurse   Bryn Gulling- Long Term Monitor Instructions   Your physician has requested you wear a ZIO patch monitor for __14_ days.  This is a single patch monitor.   [employer] supplies one patch monitor per enrollment. Additional stickers are not available. Please do not apply patch if you will be having a Nuclear Stress Test, Echocardiogram, Cardiac CT, MRI, or Chest Xray during the period you would be wearing the monitor. The patch cannot be worn during these tests. You cannot remove and re-apply the ZIO XT patch monitor.  Your ZIO patch monitor will be sent Fed Ex from [employer] directly to your home address. It may take 3-5 days to receive your monitor after you have been enrolled.  Once you have received your monitor, please review the enclosed instructions. Your monitor has already been registered assigning  a specific monitor serial # to you.  Billing and Patient Assistance Program Information   We have supplied [employer] with any of your insurance information on file for billing purposes. [employer] offers a sliding scale Patient Assistance Program for patients that do not have insurance, or whose insurance does not completely cover the cost of the ZIO monitor.   You must apply for the Patient Assistance Program to qualify for this discounted rate.     To apply, please call [employer] at [phone], select option 4, then select option 2, and ask to apply for Patient Assistance Program.  Theodore Demark will ask your household income, and how many people are in your household.  They will quote your out-of-pocket cost based on that information.  [employer] will also be able to set up a 19-month, interest-free payment plan if needed.  Applying  the monitor   Shave hair from upper left chest.  Hold abrader disc by orange tab. Rub abrader in 40 strokes over the upper left chest as indicated in your monitor instructions.  Clean area with 4 enclosed alcohol pads. Let dry.  Apply patch as indicated in monitor instructions. Patch will be placed under collarbone on left side of chest with arrow pointing upward.  Rub patch adhesive wings for 2 minutes. Remove white label marked "1". Remove the white label marked "2". Rub patch adhesive wings for 2 additional minutes.  While looking in a mirror, press and release button in center of patch. A small green light will flash 3-4 times. This will be your only indicator that the monitor has been turned on.    Do not shower for the first 24 hours. You may shower after the first 24 hours.  Press the button if you feel a symptom. You will hear a small click. Record Date, Time and Symptom in the Patient Logbook.  When you are ready to remove the patch, follow instructions on the last 2 pages of the Patient Logbook. Stick patch monitor onto the last page of Patient Logbook.  Place Patient Logbook in the blue and white box.  Use locking tab on box and tape box closed securely.  The blue and white box has prepaid postage on it. Please place it in the mailbox as soon as possible. Your physician should have your test results approximately 7 days after the monitor has been mailed back to [facility].  Call [place] at [phone] if you have questions regarding your ZIO XT patch monitor. Call them immediately if you see an orange light blinking on your monitor.  If your monitor falls off in less than 4 days, contact our Monitor department at [phone].  If your monitor becomes loose or falls off after 4 days call [employer] at [phone] for suggestions on securing your monitor.

## 2020-11-04 NOTE — Telephone Encounter (Signed)
Pamela Bergeron, MD at 11/04/2020 11:51 AM  Status: Signed    I think a zio is a great idea. Can we send her a 2 week zio patch to see if we can capture the irregular rhythm that was detected on her watch. Thank you!       Mychart message sent to the pt endorsing to her that Dr. Johney Frame would like for her to get a 2 week zio, to further evaluate if she is truly having afib or not, as indicated on her apple watch. Pt is aware that I will place the order for the zio in the system and send our monitor techs a message to enroll her and mail this to her address on file.  Zio instructions were sent to the pts mychart account as well. She was advised to mychart back or call the office with any additional questions or concerns, regarding this plan.

## 2020-11-04 NOTE — Telephone Encounter (Signed)
I think a zio is a great idea. Can we send her a 2 week zio patch to see if we can capture the irregular rhythm that was detected on her watch. Thank you!

## 2020-11-04 NOTE — Telephone Encounter (Signed)
Nuala Alpha, LPN at 08/08/4823 0:03 PM  Status: Signed    RE: 2 week zio per Johney Frame Received: Today Bowman, Mauri Reading, LPN Monitor mailed :-)

## 2020-11-06 DIAGNOSIS — I499 Cardiac arrhythmia, unspecified: Secondary | ICD-10-CM | POA: Diagnosis not present

## 2020-11-06 DIAGNOSIS — R002 Palpitations: Secondary | ICD-10-CM | POA: Diagnosis not present

## 2020-11-06 DIAGNOSIS — I4891 Unspecified atrial fibrillation: Secondary | ICD-10-CM | POA: Diagnosis not present

## 2020-12-03 ENCOUNTER — Telehealth: Payer: Self-pay | Admitting: *Deleted

## 2020-12-03 NOTE — Telephone Encounter (Signed)
Erroneous encounter  Will close

## 2020-12-16 ENCOUNTER — Ambulatory Visit: Payer: Medicare Other | Admitting: Podiatry

## 2020-12-16 ENCOUNTER — Other Ambulatory Visit: Payer: Self-pay

## 2020-12-16 DIAGNOSIS — M79675 Pain in left toe(s): Secondary | ICD-10-CM

## 2020-12-16 DIAGNOSIS — L600 Ingrowing nail: Secondary | ICD-10-CM | POA: Diagnosis not present

## 2020-12-16 DIAGNOSIS — M79674 Pain in right toe(s): Secondary | ICD-10-CM

## 2020-12-16 DIAGNOSIS — B351 Tinea unguium: Secondary | ICD-10-CM

## 2020-12-18 NOTE — Progress Notes (Signed)
Subjective:   Patient ID: [name], female   DOB: 80 y.o.   MRN: [id number]   HPI 80 year old female presents the office today for concerns of ingrown toenails.  She states that she gets pedicures on a regular basis and this helps with the discomfort.  Last time she was on a pedicure she was told that laser was an option to help.  The nails when they do get ingrown before trimming they are painful but after they get trimmed by the pedicurist this helps.  No drainage or bleeding.   Review of Systems  All other systems reviewed and are negative.  Past Medical History:  Diagnosis Date   Allergy    Cataract    bilaterally removed    Chronic kidney disease 08/2017   seen in the ED- told diverticulitis but was truly kidney stones    Osteopenia    Thyroid disease    hypothyroid    Past Surgical History:  Procedure Laterality Date   BLADDER SUSPENSION     BREAST REDUCTION SURGERY     COLONOSCOPY     COSMETIC SURGERY     IRRIGATION AND DEBRIDEMENT OF WOUND WITH SPLIT THICKNESS SKIN GRAFT Bilateral 10/18/2012   Procedure: Evacuation of hematoma right face;  Surgeon: Renee Pain, MD;  Location: [place];  Service: Plastics;  Laterality: Bilateral;  Removal of left face stitches   POLYPECTOMY       Current Outpatient Medications:    Calcium Citrate (CITRACAL PO), Take by mouth., Disp: , Rfl:    Cholecalciferol (VITAMIN D) 2000 UNITS CAPS, Take 4,000 Units by mouth daily. , Disp: , Rfl:    Coenzyme Q10 (COQ10 PO), Take by mouth., Disp: , Rfl:    COLLAGEN PO, Take by mouth., Disp: , Rfl:    Cyanocobalamin (VITAMIN B12 PO), Take by mouth. 5000 mcg once a week, Disp: , Rfl:    Fluticasone Propionate 93 MCG/ACT EXHU, Place into the nose., Disp: , Rfl:    Levocetirizine Dihydrochloride (XYZAL PO), Take by mouth., Disp: , Rfl:    levothyroxine (SYNTHROID, LEVOTHROID) 50 MCG tablet, Take 50 mcg by mouth daily before breakfast., Disp: , Rfl:    levothyroxine (SYNTHROID, LEVOTHROID) 75  MCG tablet, , Disp: , Rfl:    loperamide (IMODIUM) 2 MG capsule, Take by mouth., Disp: , Rfl:    MILK THISTLE PO, Take by mouth. Taking 375 mg daily, Disp: , Rfl:    Misc Natural Products (BETA-SITOSTEROL PLANT STEROLS) CAPS, Take by mouth., Disp: , Rfl:    Multiple Minerals-Vitamins (CALCIUM-MAGNESIUM-ZINC-D3) TABS, Take by mouth., Disp: , Rfl:    omega-3 acid ethyl esters (LOVAZA) 1 G capsule, Take by mouth 2 (two) times daily., Disp: , Rfl:    Probiotic Product (PROBIOTIC DAILY PO), Take by mouth 2 (two) times daily., Disp: , Rfl:    rosuvastatin (CRESTOR) 5 MG tablet, Take 1 tablet (5 mg total) by mouth daily., Disp: 90 tablet, Rfl: 3   Triamcinolone Acetonide (NASACORT AQ NA), Place into the nose., Disp: , Rfl:    TURMERIC CURCUMIN PO, Take by mouth., Disp: , Rfl:    UNABLE TO FIND, Lion's mane Mushroom 1/2 tsp/day, Disp: , Rfl:   Current Facility-Administered Medications:    0.9 %  sodium chloride infusion, 500 mL, Intravenous, Once, Nandigam, Kavitha V, MD  Allergies  Allergen Reactions   Penicillins     REACTION: swelling          Objective:  Physical Exam  General: AAO x3, NAD  Dermatological: Bilateral hallux nails are mildly dystrophic and hypertrophic with brown discoloration and there is incurvation of the nails present without any edema, erythema.  The symptomatic portion of ingrown nails far the right lateral and the left medial.  No signs of infection today.  Vascular: Dorsalis Pedis artery and Posterior Tibial artery pedal pulses are 2/4 bilateral with immedate capillary fill time.  There is no pain with calf compression, swelling, warmth, erythema.   Neruologic: Grossly intact via light touch bilateral.    Musculoskeletal: No significant pain today on the ingrown toenails.  Muscular strength 5/5 in all groups tested bilateral.  Gait: Unassisted, Nonantalgic.       Assessment:   Ingrown toenails, ?  Onychomycosis     Plan:  -Treatment options discussed  including all alternatives, risks, and complications -Etiology of symptoms were discussed -I discussed the treatment options for ingrown toenail.  Discussed partial nail avulsion.  She does not really want to have this done unless it is urgent.  Discussed that laser can help sometimes with nail fungus does not help with ingrown toenail.  The nails are somewhat dystrophic and discolored.  I took a sample of the nails and sent for culture, pathology.  I did discuss the partial nail avulsion as well as postoperative course.  If she desires to have this done to let us know.  Trula Slade DPM

## 2020-12-19 ENCOUNTER — Telehealth: Payer: Self-pay | Admitting: *Deleted

## 2020-12-19 NOTE — Telephone Encounter (Signed)
Sent the nail culture to [facility] today and the tracking number is [id number]. Lattie Haw

## 2021-01-15 ENCOUNTER — Telehealth: Payer: Self-pay | Admitting: *Deleted

## 2021-01-15 NOTE — Telephone Encounter (Signed)
Called and spoke with the patient and relayed the message per Dr Jacqualyn Posey about the nail culture was negative and the laser likely won't help and could try a urea nail gel and patient declined and stated that she would be going and getting a pedicure every three weeks. Lattie Haw

## 2021-04-01 NOTE — Telephone Encounter (Signed)
Pt sent in mychart message about apple watch notifications. Pt is asymptomatic and rate is between 50-100 bpm when apple watch sends her notifications.   Pt recently had a monitor back in May, which showed, frequent extra beats from the top chamber of the heart. These are not harmful but can cause palpitations. There was no atrial fibrillation or sustained arrhythmias (abnormal heart rhythms). If the extra beats are very bothersome, then Dr. Johney Frame at that time advised we can start her on a low dose metoprolol, or wait to see how she feels off the xyzal and see if symptoms resolve.  Pt did not start low dose metoprolol and tried reducing xyzal in her regimen, which seemed to help.  Pt is now taking xyzal again, and notifications are popping up on her apple watch.  Advised the pt that per her last conversation with Dr. Johney Frame about apple watch notifications, she advised her the following below and I reiterated this to the pt:  As long as you remain asymptomatic and you are not have sustained episodes where your HR is >120 while at rest, I would say that your watch is just super sensitive to those extra beats. If they become bothersome or if you are sustaining fast HR while at rest, just let us know.     Being Dr. Johney Frame is out on maternity leave at this time, I will route this message to her in-basket for her Covering MD to further review and advise on.  Will follow-up with the pt accordingly thereafter.

## 2021-04-01 NOTE — Telephone Encounter (Signed)
Pt is scheduled to see [name] NP for tomorrow 9/28 at 0930.  She is aware to arrive 15 mins prior to this appt.  Pt aware of [facility] clinic address/location and where to park.

## 2021-04-02 ENCOUNTER — Ambulatory Visit (HOSPITAL_BASED_OUTPATIENT_CLINIC_OR_DEPARTMENT_OTHER): Payer: Medicare Other | Admitting: Family

## 2021-04-02 ENCOUNTER — Encounter (HOSPITAL_BASED_OUTPATIENT_CLINIC_OR_DEPARTMENT_OTHER): Payer: Self-pay | Admitting: Family

## 2021-04-02 ENCOUNTER — Other Ambulatory Visit: Payer: Self-pay

## 2021-04-02 VITALS — BP 110/70 | HR 69 | Ht 61.5 in | Wt 115.0 lb

## 2021-04-02 DIAGNOSIS — I25118 Atherosclerotic heart disease of native coronary artery with other forms of angina pectoris: Secondary | ICD-10-CM

## 2021-04-02 DIAGNOSIS — R5383 Other fatigue: Secondary | ICD-10-CM

## 2021-04-02 DIAGNOSIS — I498 Other specified cardiac arrhythmias: Secondary | ICD-10-CM

## 2021-04-02 DIAGNOSIS — E785 Hyperlipidemia, unspecified: Secondary | ICD-10-CM

## 2021-04-02 NOTE — Patient Instructions (Signed)
Medication Instructions:  Continue your current medications.   *If you need a refill on your cardiac medications before your next appointment, please call your pharmacy*  Lab Work: Your physician recommends that you return for lab work today: direct LDL, lipid panel, BMP, CBC, TSH  If you have labs (blood work) drawn today and your tests are completely normal, you will receive your results only by: [place] (if you have MyChart) OR A paper copy in the mail If you have any lab test that is abnormal or we need to change your treatment, we will call you to review the results.  Testing/Procedures: Your EKG today shows normal sinus rhythm with sinus arrhythmia. This is an occasional early beat in the top chamber of your heart.   Follow-Up: At [facility], you and your health needs are our priority.  As part of our continuing mission to provide you with exceptional heart care, we have created designated Provider Care Teams.  These Care Teams include your primary Cardiologist (physician) and Advanced Practice Providers (APPs -  Physician Assistants and Nurse Practitioners) who all work together to provide you with the care you need, when you need it.  We recommend signing up for the patient portal called "MyChart".  Sign up information is provided on this After Visit Summary.  MyChart is used to connect with patients for Virtual Visits (Telemedicine).  Patients are able to view lab/test results, encounter notes, upcoming appointments, etc.  Non-urgent messages can be sent to your provider as well.   To learn more about what you can do with MyChart, go to [email or web address].    Your next appointment:   6 month(s)  The format for your next appointment:   In Person  Provider:   You may see Pamela Bergeron, MD or one of the following Advanced Practice Providers on your designated Care Team:   Pamela Dopp, PA-C [name], [place]  Other Instructions  Heart Healthy Diet  Recommendations: A low-salt diet is recommended. Meats should be grilled, baked, or boiled. Avoid fried foods. Focus on lean protein sources like fish or chicken with vegetables and fruits. The American Heart Association is a Microbiologist!    Exercise recommendations: The American Heart Association recommends 150 minutes of moderate intensity exercise weekly. Try 30 minutes of moderate intensity exercise 4-5 times per week. This could include walking, jogging, or swimming.

## 2021-04-02 NOTE — Progress Notes (Signed)
Office Visit    Patient Name: [name] Date of Encounter: 04/02/2021  PCP:  Crist Infante, MD   [place]  Cardiologist:  [name], MD  Advanced Practice Provider:  No care team member to display Electrophysiologist:  None     Chief Complaint    [name] is a 80 y.o. female with a hx of hypothyroidism, syncope, sinus arrhythmia presents today for abnormal heart rate per smart watch  Past Medical History    Past Medical History:  Diagnosis Date   Allergy    Cataract    bilaterally removed    Chronic kidney disease 08/2017   seen in the ED- told diverticulitis but was truly kidney stones    Osteopenia    Thyroid disease    hypothyroid   Past Surgical History:  Procedure Laterality Date   BLADDER SUSPENSION     BREAST REDUCTION SURGERY     COLONOSCOPY     COSMETIC SURGERY     IRRIGATION AND DEBRIDEMENT OF WOUND WITH SPLIT THICKNESS SKIN GRAFT Bilateral 10/18/2012   Procedure: Evacuation of hematoma right face;  Surgeon: Renee Pain, MD;  Location: [facility];  Service: Plastics;  Laterality: Bilateral;  Removal of left face stitches   POLYPECTOMY      Allergies  Allergies  Allergen Reactions   Penicillins     REACTION: swelling    History of Present Illness    [name] is a 80 y.o. female with a hx of syncope, hypothyroidism, sinus arrhythmia last seen 09/10/2020.  She had episode of syncope 08/2020 in setting of quick position changed deemed orthostatic. She had CT calcium score of 49.3 all in LAD (39% for subjects in the same age, gender and race/ethnicity) recommended for statin.  She wore two cardiac monitor showing predominantly normal sinus rhythm with PACs with burden of 6% and 4%.  She was without palpitations AV nodal blocking therapy was deferred.  She presents today for follow up.  Notes her apple watch has been giving her notifications of possible atrial fibrillation.  It does not have EKG  capability but notes a regular heartbeat.  She denies palpitations. Reports no shortness of breath nor dyspnea on exertion. Reports no chest pain, pressure, or tightness. No edema, orthopnea, PND. Reports no palpitations.  Reports some fatigue but endorses this might be related to overdoing it on her exercise regimen recently.   EKGs/Labs/Other Studies Reviewed:   The following studies were reviewed today: Long term monitor 11/26/20 Patch wear time was 13 days and 21 hours Predominant rhythm was NSR with average HR 69bpm (ranging from 45-171bpm) There was 1 run of nonsustained VT lasting 10 beats at a rate of 171bpm There were 5 runs of SVT with the fastest/longest lasting 15 beats at rate 164bpm Frequent SVE (6.2%, 83685); rare VE (<1%) No afib or significant pauses     Patch Wear Time:  13 days and 21 hours (2022-05-04T11:46:05-399 to 2022-05-18T08:57:00-0400)   Patient had a min HR of 45 bpm, max HR of 171 bpm, and avg HR of 69 bpm. Predominant underlying rhythm was Sinus Rhythm. 1 run of Ventricular Tachycardia occurred lasting 10 beats with a max rate of 171 bpm (avg 153 bpm). 5 Supraventricular Tachycardia  runs occurred, the run with the fastest interval lasting 15 beats with a max rate of 164 bpm (avg 137 bpm); the run with the fastest interval was also the longest. Isolated SVEs were frequent (6.2%, 21308), SVE  Couplets were rare (<1.0%, 4014), and SVE  Triplets were rare (<1.0%, 738). Isolated VEs were rare (<1.0%), and no VE Couplets or VE Triplets were present. Ventricular Trigeminy was present.    Long term monitor 09/25/20 Patch wear time was 6 days and 22 hours Predominant rhythm was normal sinus rhythm with average HR 68bpm; ranging from 42-146bpm There were 3 episodes of SVT with the fastest/longest lasting 5 beats at HR 146bpm Occasional SVE (4.1%); rare PVCs Patient triggered events correlated with sinus rhythm No Afib, significant pauses, VT or sustained  arrhythmias Overall, no significant arrhythmias on cardiac monitor     Patch Wear Time:  6 days and 22 hours (2022-03-10T15:41:28-0500 to 2022-03-17T15:20:20-0400)   Patient had a min HR of 43 bpm, max HR of 146 bpm, and avg HR of 68 bpm. Predominant underlying rhythm was Sinus Rhythm. 3 Supraventricular Tachycardia runs occurred, the run with the fastest interval lasting 5 beats with a max rate of 146 bpm (avg 140  bpm); the run with the fastest interval was also the longest. Isolated SVEs were occasional (4.1%, 27062), and no SVE Couplets or SVE Triplets were present. Isolated VEs were rare (<1.0%), and no VE Couplets or VE Triplets were present.     CT calcium score 05/2020: FINDINGS: CORONARY CALCIUM SCORES:   Left Main: 0   LAD: 49.3   LCx: 0   RCA: 0   Total Agatston Score: 49.3   MESA database percentile: 39   AORTA MEASUREMENTS:   Ascending Aorta: 31 mm   Descending Aorta: 20 mm   OTHER FINDINGS:   Heart is normal size. Calcifications in the aortic root and descending thoracic aorta. Calcified mediastinal and left hilar lymph nodes. Calcified granuloma in the left lower lobe. Linear scarring in the lingula and left lower lobe at the lung base. No effusions. Imaging into the upper abdomen demonstrates no acute findings. Chest wall soft tissues are unremarkable. No acute bony abnormality.   IMPRESSION: The observed calcium score of 49.3 is at the percentile 39 for subjects of the same age, gender and race/ethnicity who are free of clinical cardiovascular disease and treated diabetes.   No acute extra cardiac abnormality.    EKG:  EKG is  ordered today.  The ekg ordered today demonstrates SR 69 bpm with sinus arrhythmia and PACs. No acute ST/T wave changes.   Recent Labs: No results found for requested labs within last 8760 hours.  Recent Lipid Panel    Component Value Date/Time   CHOL 150 10/22/2020 0808   TRIG 63 10/22/2020 0808   HDL 77 10/22/2020  0808   CHOLHDL 1.9 10/22/2020 0808   LDLCALC 60 10/22/2020 0808    Home Medications   Current Meds  Medication Sig   Calcium Citrate (CITRACAL PO) Take by mouth.   Cholecalciferol (VITAMIN D) 2000 UNITS CAPS Take 4,000 Units by mouth daily.    Coenzyme Q10 (COQ10 PO) Take by mouth.   COLLAGEN PO Take by mouth.   Cyanocobalamin (VITAMIN B12 PO) Take by mouth. 5000 mcg once a week   Fluticasone Propionate 93 MCG/ACT EXHU Place into the nose.   Levocetirizine Dihydrochloride (XYZAL PO) Take by mouth.   levothyroxine (SYNTHROID, LEVOTHROID) 75 MCG tablet    loperamide (IMODIUM) 2 MG capsule Take by mouth.   MILK THISTLE PO Take by mouth. Taking 375 mg daily   Multiple Minerals-Vitamins (CALCIUM-MAGNESIUM-ZINC-D3) TABS Take by mouth.   omega-3 acid ethyl esters (LOVAZA) 1 G capsule Take by mouth 2 (two) times  daily.   Probiotic Product (PROBIOTIC DAILY PO) Take by mouth 2 (two) times daily.   rosuvastatin (CRESTOR) 5 MG tablet Take 1 tablet (5 mg total) by mouth daily.   Triamcinolone Acetonide (NASACORT AQ NA) Place into the nose.   TURMERIC CURCUMIN PO Take by mouth.   Current Facility-Administered Medications for the 04/02/21 encounter (Office Visit) with Loel Dubonnet, NP  Medication   0.9 %  sodium chloride infusion     Review of Systems      All other systems reviewed and are otherwise negative except as noted above.  Physical Exam    VS:  BP 110/70 (BP Location: Left Arm, Patient Position: Sitting, Cuff Size: Normal)   Pulse 69   Ht 5' 1.5" (1.562 m)   Wt 115 lb (52.2 kg)   SpO2 98%   BMI 21.38 kg/m  , BMI Body mass index is 21.38 kg/m.  Wt Readings from Last 3 Encounters:  04/02/21 115 lb (52.2 kg)  09/10/20 113 lb (51.3 kg)  12/23/18 121 lb (54.9 kg)    GEN: Well nourished, well developed, in no acute distress. HEENT: normal. Neck: Supple, no JVD, carotid bruits, or masses. Cardiac: RRR, no murmurs, rubs, or gallops. No clubbing, cyanosis, edema.   Radials/PT 2+ and equal bilaterally.  Respiratory:  Respirations regular and unlabored, clear to auscultation bilaterally. GI: Soft, nontender, nondistended. MS: No deformity or atrophy. Skin: Warm and dry, no rash. Neuro:  Strength and sensation are intact. Psych: Normal affect.  Assessment & Plan    Syncope-no recurrence.  Previous episodes consistent with orthostatic.  She has increased hydration and had no recurrent lightheadedness or dizziness.  Cardiac monitor with no significant pauses.  Sinus arrhythmia / PAC -noted by EKG today.  Her apple watch has been getting her notifications of possible atrial fibrillation due to irregular heart rate and we discussed that sinus arrhythmia was very common and not of concern.  She tells me she will turn the alert off on her watch and this is understandably anxiety inducing.  Previously on monitors with PAC burden of 4% 6%.  She is asymptomatic without palpitations and prefers to avoid AV nodal blocking therapy at this time.  She will contact us if symptoms arise. TSH, CMP, CBC to rule out lab abnormality as etiology of PAC/sinus arrhythmia.  HLD, LDL goal <70 - 10/2020 LDL 60. Continue Crestor 5mg  QD. Repeat lipid panel for monitoring per patient request.  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 No indication for ischemic evaluation.  GDMT includes aspirin, Crestor. Heart healthy diet and regular cardiovascular exercise encouraged.    Disposition: Follow up in 6 month(s) with Dr. Johney Frame or APP.  Signed, Loel Dubonnet, NP 04/02/2021, 5:06 PM [place]

## 2021-04-03 LAB — TSH: TSH: 2.22 u[IU]/mL (ref 0.450–4.500)

## 2021-04-03 LAB — BASIC METABOLIC PANEL
BUN/Creatinine Ratio: 34 — ABNORMAL HIGH (ref 12–28)
BUN: 25 mg/dL (ref 8–27)
CO2: 25 mmol/L (ref 20–29)
Calcium: 9.6 mg/dL (ref 8.7–10.3)
Chloride: 100 mmol/L (ref 96–106)
Creatinine, Ser: 0.74 mg/dL (ref 0.57–1.00)
Glucose: 94 mg/dL (ref 70–99)
Potassium: 4.9 mmol/L (ref 3.5–5.2)
Sodium: 138 mmol/L (ref 134–144)
eGFR: 82 mL/min/{1.73_m2} (ref >59)

## 2021-04-03 LAB — CBC
Hematocrit: 43.3 % (ref 34.0–46.6)
Hemoglobin: 14.5 g/dL (ref 11.1–15.9)
MCH: 31.4 pg (ref 26.6–33.0)
MCHC: 33.5 g/dL (ref 31.5–35.7)
MCV: 94 fL (ref 79–97)
Platelets: 242 10*3/uL (ref 150–450)
RBC: 4.62 x10E6/uL (ref 3.77–5.28)
RDW: 11.9 % (ref 11.7–15.4)
WBC: 5.8 10*3/uL (ref 3.4–10.8)

## 2021-04-03 LAB — LIPID PANEL
Chol/HDL Ratio: 2.1 ratio (ref 0.0–4.4)
Cholesterol, Total: 158 mg/dL (ref 100–199)
HDL: 76 mg/dL (ref >39)
LDL Chol Calc (NIH): 58 mg/dL (ref 0–99)
Triglycerides: 141 mg/dL (ref 0–149)
VLDL Cholesterol Cal: 24 mg/dL (ref 5–40)

## 2021-04-03 LAB — LDL CHOLESTEROL, DIRECT: LDL Direct: 65 mg/dL (ref 0–99)

## 2021-06-12 ENCOUNTER — Telehealth: Payer: Self-pay | Admitting: Cardiology

## 2021-06-12 DIAGNOSIS — R002 Palpitations: Secondary | ICD-10-CM

## 2021-06-12 NOTE — Telephone Encounter (Signed)
Reports she awoke about 3 am this morning "unable to breath". She felt like she couldn't take a "good partial breath", this scared/concerned her. Episode lasted about 15-20 min, she does report that it felt like she "had a fist in my chest during this" and could feel abnormal heart rhythm. She denies any issues currently or reoccurrence of dyspnea. Reports that one other episode like this many years ago. She has stopped her antihistamine which seemed to trigger events of palpitations. She turned off her apple watch monitoring back in September so she does not have any readings from  that.  She did start wearing again today.  Aware I will forward this to the nurse to discuss with MD. Aware she will follow up after MD reviews/advises. Advised to call back if episode reoccurs prior to [place] calling back. Patient verbalized understanding and agreeable to plan.

## 2021-06-12 NOTE — Telephone Encounter (Signed)
RE: echo per Dr. Johney Frame Received: Today Dario Guardian, LPN Good Morning! Patient is scheduled for 06/27/21. :)

## 2021-06-12 NOTE — Telephone Encounter (Signed)
[place], [place]" - 06/12/2021  9:02 AM [name], MD  Sent: Thu June 12, 2021  9:51 AM  To: Nuala Alpha, LPN          Message  I am sorry this happened. It may have been related to the extra beats she has. Can we scheduled her for an echo for palpitations? If she wants, we can also try low dose metoprolol to see if this helps prevent this from occurring. Let us know if it happens again and we can get her in.   Pt aware of recommendations per Dr. Johney Frame. Pt states she just turned her apple watch back on and will continue to monitor for now.  Pt would like to hold off on starting metoprolol at this time, and see how her recordings from her apple watch come back. Pt states she is feeling much better this morning.  Pt would like to proceed with getting and echo done. Informed the pt that I will place the order for the echo in the system and send our Echo Scheduler a message to call her back and arrange.  Pt verbalized understanding and agrees with this plan.

## 2021-06-12 NOTE — Telephone Encounter (Signed)
Pt c/o Shortness Of Breath: STAT if SOB developed within the last 24 hours or pt is noticeably SOB on the phone  1. Are you currently SOB (can you hear that pt is SOB on the phone)? No   2. How long have you been experiencing SOB? Since overnight  3. Are you SOB when sitting or when up moving around? Yes   4. Are you currently experiencing any other symptoms? Chest fills tight in the middle

## 2021-06-27 ENCOUNTER — Other Ambulatory Visit: Payer: Self-pay

## 2021-06-27 ENCOUNTER — Ambulatory Visit (HOSPITAL_COMMUNITY): Payer: Medicare Other | Attending: Cardiology

## 2021-06-27 DIAGNOSIS — E039 Hypothyroidism, unspecified: Secondary | ICD-10-CM | POA: Insufficient documentation

## 2021-06-27 DIAGNOSIS — N189 Chronic kidney disease, unspecified: Secondary | ICD-10-CM | POA: Diagnosis not present

## 2021-06-27 DIAGNOSIS — I700 Atherosclerosis of aorta: Secondary | ICD-10-CM | POA: Diagnosis not present

## 2021-06-27 DIAGNOSIS — R002 Palpitations: Secondary | ICD-10-CM | POA: Insufficient documentation

## 2021-06-28 LAB — ECHOCARDIOGRAM COMPLETE
Area-P 1/2: 2.79 cm2
S' Lateral: 2 cm

## 2021-07-03 ENCOUNTER — Encounter: Payer: Self-pay | Admitting: Cardiology

## 2021-08-11 ENCOUNTER — Other Ambulatory Visit: Payer: Self-pay | Admitting: *Deleted

## 2021-08-11 DIAGNOSIS — E782 Mixed hyperlipidemia: Secondary | ICD-10-CM

## 2021-08-11 MED ORDER — ROSUVASTATIN CALCIUM 5 MG PO TABS: 5.0000 mg | ORAL_TABLET | Freq: Every day | ORAL | 3 refills | Status: DC

## 2021-09-01 IMAGING — CT CT CARDIAC CORONARY ARTERY CALCIUM SCORE
3 series · 14 of 20 positions shown, 16 images · non-contrast
Comparison: None.

CLINICAL DATA: Elevated cholesterol

EXAM:
CT CARDIAC CORONARY ARTERY CALCIUM SCORE
TECHNIQUE: Non-contrast imaging through the heart was performed using
prospective ECG gating. Image post processing was performed on an
independent workstation, allowing for quantitative analysis of the
heart and coronary arteries. Note that this exam targets the heart
and the chest was not imaged in its entirety.

[Series 2: calcium scoring 2.00 qr36 bestdiast 68% hrt calciu · axial · 0.35mm/px · z∈[+1640,+1724]mm · 4 of 70 slices shown]
[im 14/70  vessel]
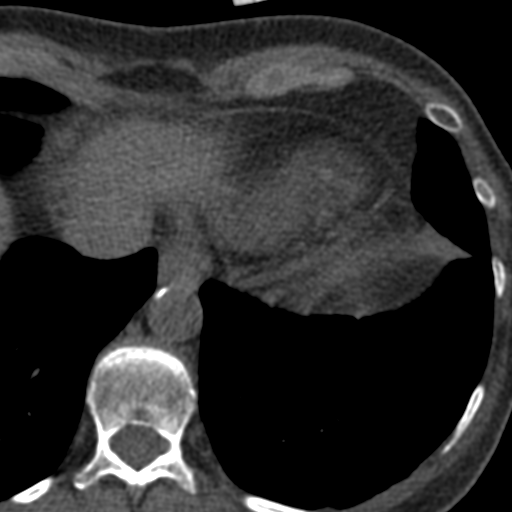
[im 28/70  vessel]
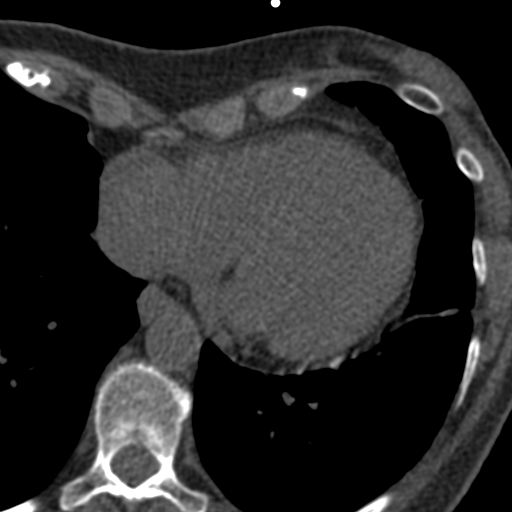
[im 42/70  vessel]
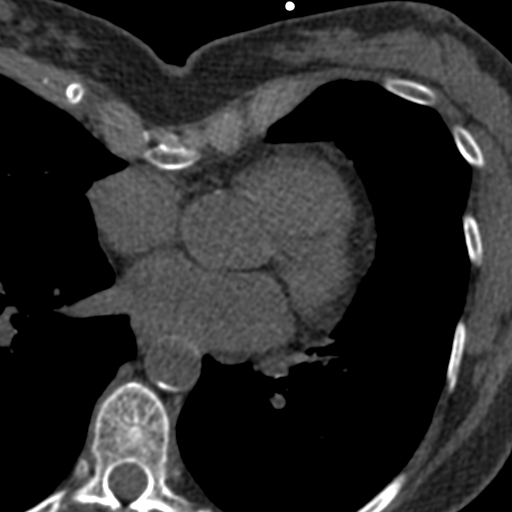
[im 56/70  vessel]
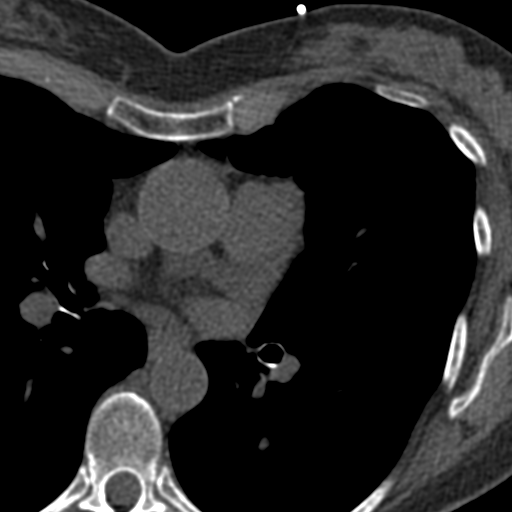

[Series 3: calcium scoring 2.00 br40 bestdiast 68% axial · axial · 0.49mm/px · z∈[+1636,+1728]mm · 5 of 70 slices shown, 7 images]
[im 12/70  vessel]
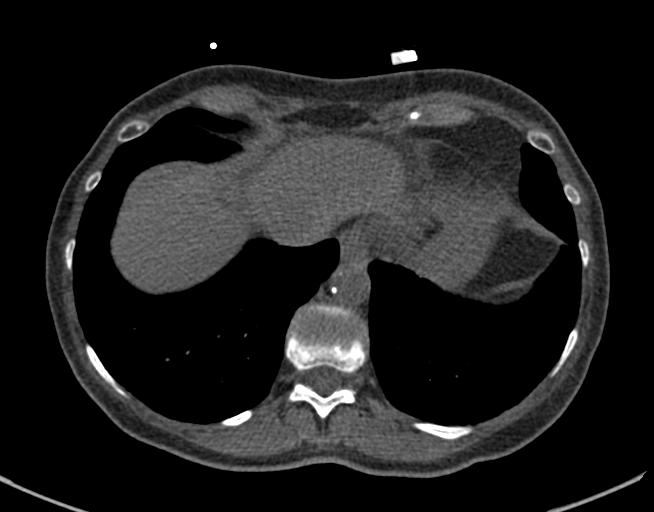
[im 12/70  lung]
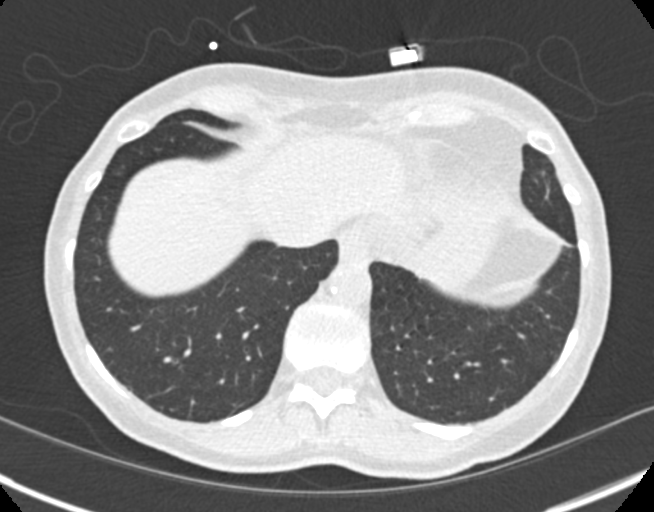
[im 24/70  vessel]
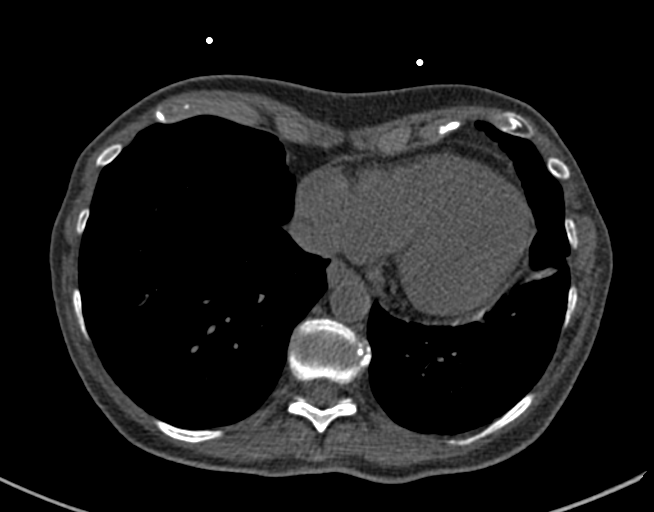
[im 35/70  vessel]
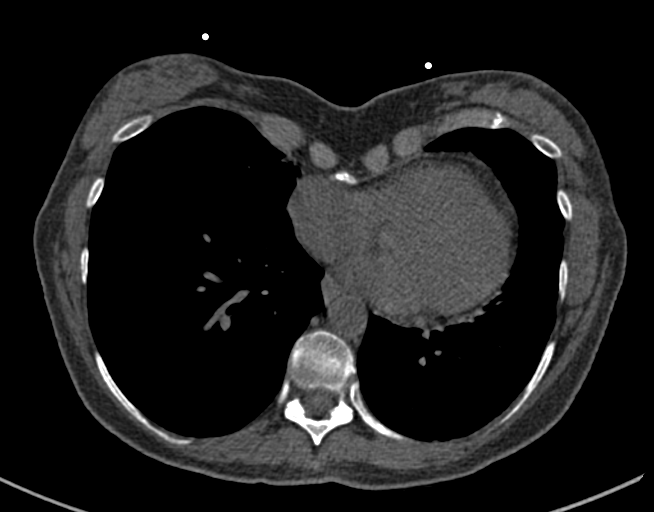
[im 47/70  vessel]
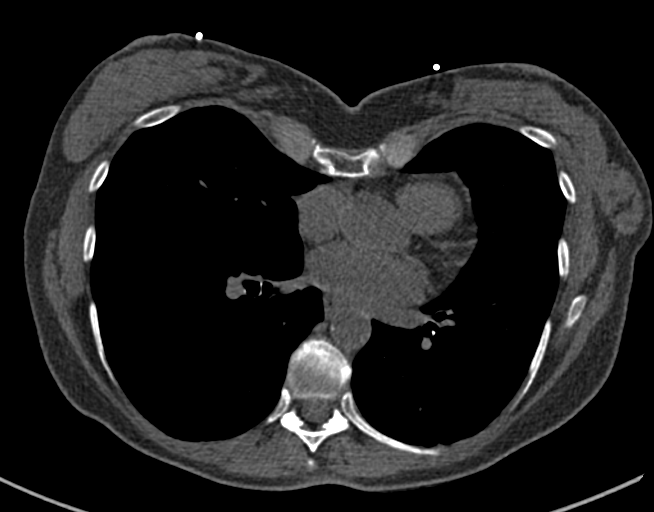
[im 58/70  vessel]
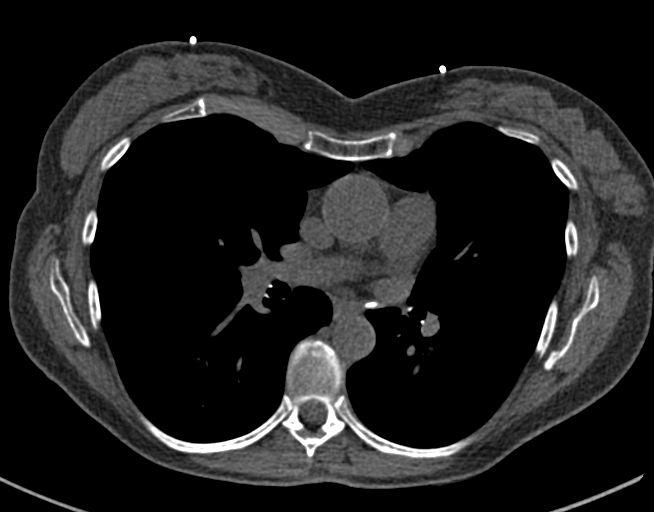
[im 58/70  lung]
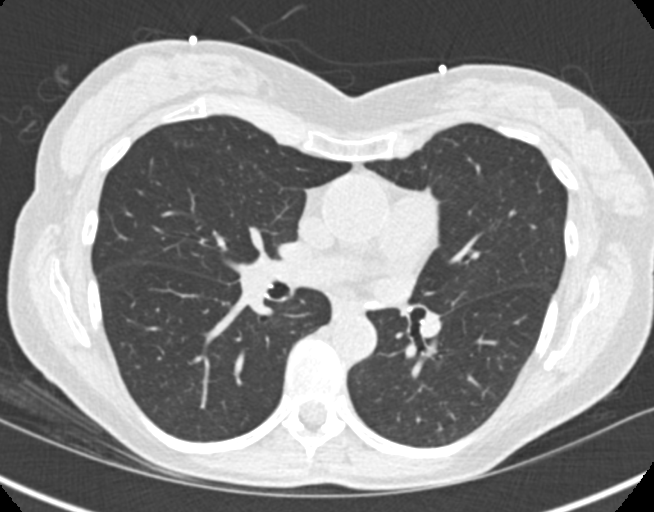

[Series 9: calcium scoring 2.00 br60 bestdiast 68% lungs · axial · 0.49mm/px · z∈[+1636,+1728]mm · 5 of 70 slices shown]
[im 12/70  vessel]
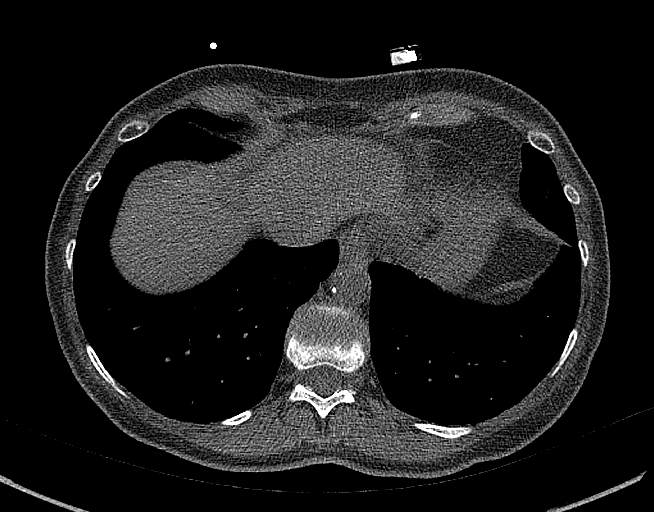
[im 24/70  vessel]
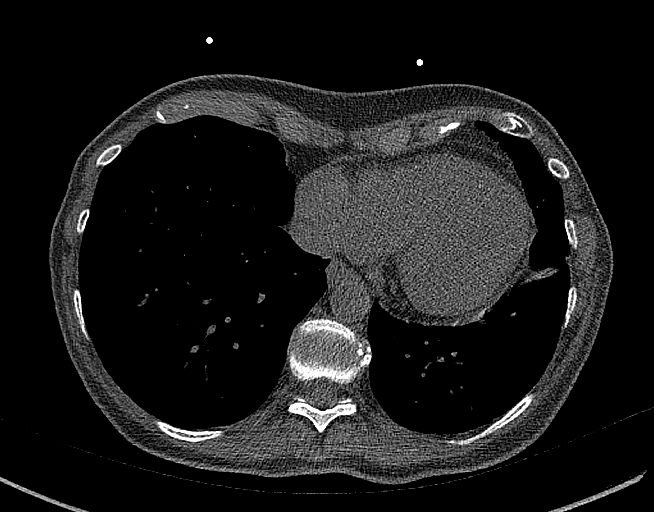
[im 35/70  vessel]
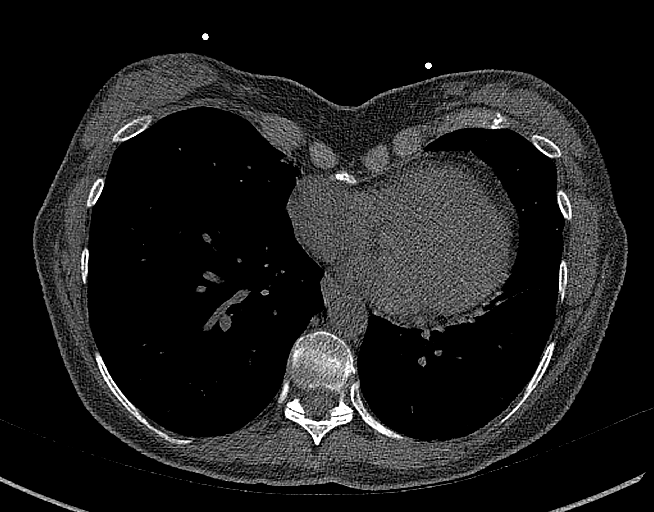
[im 47/70  vessel]
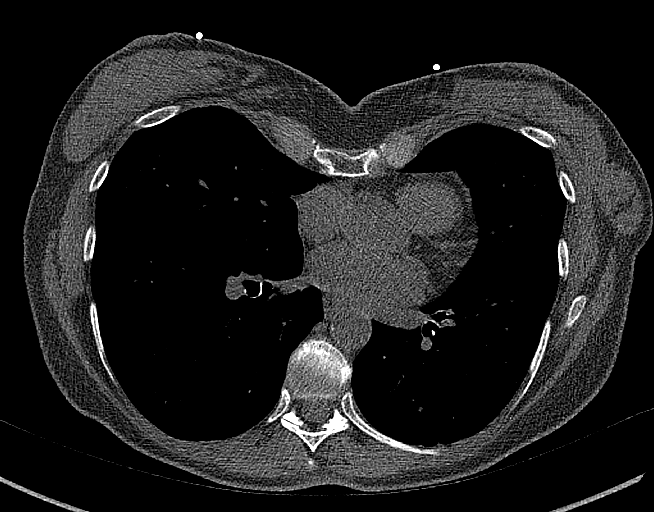
[im 58/70  vessel]
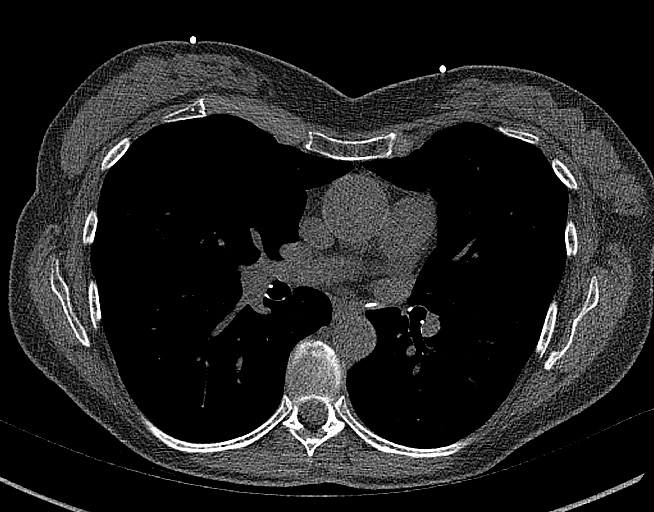

[14 of 20 positions shown; findings below may reference images not displayed]

FINDINGS: CORONARY CALCIUM SCORES:

Left Main: 0

LAD:

LCx: 0

RCA: 0

Total Agatston Score:

[HOSPITAL] percentile: 39

AORTA MEASUREMENTS:

Ascending Aorta: 31 mm

Descending Aorta: 20 mm

OTHER FINDINGS:

Heart is normal size. Calcifications in the aortic root and
descending thoracic aorta. Calcified mediastinal and left hilar
lymph nodes. Calcified granuloma in the left lower lobe. Linear
scarring in the lingula and left lower lobe at the lung base. No
effusions. Imaging into the upper abdomen demonstrates no acute
findings. Chest wall soft tissues are unremarkable. No acute bony
abnormality.
IMPRESSION: The observed calcium score of 49.3 is at the percentile 39 for
subjects of the same age, gender and race/ethnicity who are free of
clinical cardiovascular disease and treated diabetes.

No acute extra cardiac abnormality.

## 2021-09-24 NOTE — Progress Notes (Deleted)
?Cardiology Office Note:   ? ?Date:  09/24/2021  ? ?ID:  [name], DOB May 13, 1941, MRN [id number] ? ?PCP:  Crist Infante, MD ?  ?[place]  ?Cardiologist:  [name], MD  ?Advanced Practice Provider:  No care team member to display ?Electrophysiologist:  None  ? ? ?Referring MD: Crist Infante, MD  ? ? ? ?History of Present Illness:   ? ?[name] is a 81 y.o. female with a hx of hypothyroidism who presents to clinic for follow-up. ? ?She had episode of syncope 08/2020 in setting of quick position changed deemed orthostatic. She had CT calcium score of 49.3 all in LAD (39% for subjects in the same age, gender and race/ethnicity) recommended for statin.  She wore two cardiac monitor showing predominantly normal sinus rhythm with PACs with burden of 6% and 4%.  She was without palpitations AV nodal blocking therapy was deferred. ? ?Last saw [name] on 04/02/21 where her apple watch has been giving her notifications of Afib. She was asymptomatic at that time. Symptoms thought to be due to sinus arrhythmia and PACs.  ? ?Today, ***  ? ? ?Past Medical History:  ?Diagnosis Date  ? Allergy   ? Cataract   ? bilaterally removed   ? Chronic kidney disease 08/2017  ? seen in the ED- told diverticulitis but was truly kidney stones   ? Osteopenia   ? Thyroid disease   ? hypothyroid  ? ? ?Past Surgical History:  ?Procedure Laterality Date  ? BLADDER SUSPENSION    ? BREAST REDUCTION SURGERY    ? COLONOSCOPY    ? COSMETIC SURGERY    ? IRRIGATION AND DEBRIDEMENT OF WOUND WITH SPLIT THICKNESS SKIN GRAFT Bilateral 10/18/2012  ? Procedure: Evacuation of hematoma right face;  Surgeon: Renee Pain, MD;  Location: [facility];  Service: Plastics;  Laterality: Bilateral;  Removal of left face stitches  ? POLYPECTOMY    ? ? ?Current Medications: ?No outpatient medications have been marked as taking for the 09/29/21 encounter (Appointment) with [name], MD.  ? ?Current  Facility-Administered Medications for the 09/29/21 encounter (Appointment) with [name], MD  ?Medication  ? 0.9 %  sodium chloride infusion  ?  ? ?Allergies:   Penicillins  ? ?Social History  ? ?Socioeconomic History  ? Marital status: Married  ?  Spouse name: Not on file  ? Number of children: Not on file  ? Years of education: Not on file  ? Highest education level: Not on file  ?Occupational History  ? Not on file  ?Tobacco Use  ? Smoking status: Never  ? Smokeless tobacco: Never  ?Vaping Use  ? Vaping Use: Never used  ?Substance and Sexual Activity  ? Alcohol use: Yes  ?  Comment: occ glass of wine   ? Drug use: No  ? Sexual activity: Not on file  ?Other Topics Concern  ? Not on file  ?Social History Narrative  ? Not on file  ? ?Social Determinants of Health  ? ?Financial Resource Strain: Not on file  ?Food Insecurity: Not on file  ?Transportation Needs: Not on file  ?Physical Activity: Not on file  ?Stress: Not on file  ?Social Connections: Not on file  ?  ? ?Family History: ?The patient's family history is negative for Colon cancer, Colon polyps, Esophageal cancer, Rectal cancer, and Stomach cancer. ? ?ROS:   ?Please see the history of present illness.    ?Review of Systems  ?  Constitutional:  Negative for chills and fever.  ?HENT:  Negative for hearing loss.   ?Eyes:  Negative for blurred vision and redness.  ?Respiratory:  Negative for shortness of breath.   ?Cardiovascular:  Negative for chest pain, palpitations, orthopnea, claudication, leg swelling and PND.  ?Gastrointestinal:  Negative for melena, nausea and vomiting.  ?Genitourinary:  Negative for dysuria and flank pain.  ?Musculoskeletal:  Negative for myalgias.  ?Neurological:  Positive for dizziness and loss of consciousness. Negative for focal weakness and seizures.  ?Endo/Heme/Allergies:  Negative for polydipsia.  ?Psychiatric/Behavioral:  Negative for substance abuse.   ? ?EKGs/Labs/Other Studies Reviewed:   ? ?The following studies  were reviewed today: ?Long term monitor 11/26/20 ?Patch wear time was 13 days and 21 hours ?Predominant rhythm was NSR with average HR 69bpm (ranging from 45-171bpm) ?There was 1 run of nonsustained VT lasting 10 beats at a rate of 171bpm ?There were 5 runs of SVT with the fastest/longest lasting 15 beats at rate 164bpm ?Frequent SVE (6.2%, 30865); rare VE (<1%) ?No afib or significant pauses ?  ?  ?Patch Wear Time:  13 days and 21 hours (2022-05-04T11:46:05-399 to 2022-05-18T08:57:00-0400) ?  ?Patient had a min HR of 45 bpm, max HR of 171 bpm, and avg HR of 69 bpm. Predominant underlying rhythm was Sinus Rhythm. 1 run of Ventricular Tachycardia occurred lasting 10 beats with a max rate of 171 bpm (avg 153 bpm). 5 Supraventricular Tachycardia  ?runs occurred, the run with the fastest interval lasting 15 beats with a max rate of 164 bpm (avg 137 bpm); the run with the fastest interval was also the longest. Isolated SVEs were frequent (6.2%, 78469), SVE Couplets were rare (<1.0%, 4014), and SVE  ?Triplets were rare (<1.0%, 738). Isolated VEs were rare (<1.0%), and no VE Couplets or VE Triplets were present. Ventricular Trigeminy was present.  ?  ?Long term monitor 09/25/20 ?Patch wear time was 6 days and 22 hours ?Predominant rhythm was normal sinus rhythm with average HR 68bpm; ranging from 42-146bpm ?There were 3 episodes of SVT with the fastest/longest lasting 5 beats at HR 146bpm ?Occasional SVE (4.1%); rare PVCs ?Patient triggered events correlated with sinus rhythm ?No Afib, significant pauses, VT or sustained arrhythmias ?Overall, no significant arrhythmias on cardiac monitor ?  ?  ?Patch Wear Time:  6 days and 22 hours (2022-03-10T15:41:28-0500 to 2022-03-17T15:20:20-0400) ?  ?Patient had a min HR of 43 bpm, max HR of 146 bpm, and avg HR of 68 bpm. Predominant underlying rhythm was Sinus Rhythm. 3 Supraventricular Tachycardia runs occurred, the run with the fastest interval lasting 5 beats with a max rate of  146 bpm (avg 140  ?bpm); the run with the fastest interval was also the longest. Isolated SVEs were occasional (4.1%, 62952), and no SVE Couplets or SVE Triplets were present. Isolated VEs were rare (<1.0%), and no VE Couplets or VE Triplets were present.  ?  ?  ?CT calcium score 05/2020: ?FINDINGS: ?CORONARY CALCIUM SCORES: ?  ?Left Main: 0 ?  ?LAD: 49.3 ?  ?LCx: 0 ?  ?RCA: 0 ?  ?Total Agatston Score: 49.3 ?  ?MESA database percentile: 35 ?  ?AORTA MEASUREMENTS: ?  ?Ascending Aorta: 31 mm ?  ?Descending Aorta: 20 mm ?  ?OTHER FINDINGS: ?  ?Heart is normal size. Calcifications in the aortic root and ?descending thoracic aorta. Calcified mediastinal and left hilar ?lymph nodes. Calcified granuloma in the left lower lobe. Linear ?scarring in the lingula and left lower lobe at the lung base.  No ?effusions. Imaging into the upper abdomen demonstrates no acute ?findings. Chest wall soft tissues are unremarkable. No acute bony ?abnormality. ?  ?IMPRESSION: ?The observed calcium score of 49.3 is at the percentile 39 for ?subjects of the same age, gender and race/ethnicity who are free of ?clinical cardiovascular disease and treated diabetes. ?  ?No acute extra cardiac abnormality. ?  ? ? ?Recent Labs: ?04/02/2021: BUN 25; Creatinine, Ser 0.74; Hemoglobin 14.5; Platelets 242; Potassium 4.9; Sodium 138; TSH 2.220  ?Recent Lipid Panel ?   ?Component Value Date/Time  ? CHOL 158 04/02/2021 1029  ? TRIG 141 04/02/2021 1029  ? HDL 76 04/02/2021 1029  ? CHOLHDL 2.1 04/02/2021 1029  ? [place] 58 04/02/2021 1029  ? LDLDIRECT 65 04/02/2021 1029  ? ? ? ?Risk Assessment/Calculations:   ?  ? ? ?Physical Exam:   ? ?VS:  There were no vitals taken for this visit.   ? ?Wt Readings from Last 3 Encounters:  ?04/02/21 115 lb (52.2 kg)  ?09/10/20 113 lb (51.3 kg)  ?12/23/18 121 lb (54.9 kg)  ?  ? ?GEN:  Well nourished, well developed in no acute distress ?HEENT: Normal ?NECK: No JVD; No carotid bruits ?LYMPHATICS: No lymphadenopathy ?CARDIAC:  RRR, no murmurs, rubs, gallops ?RESPIRATORY:  Clear to auscultation without rales, wheezing or rhonchi  ?ABDOMEN: Soft, non-tender, non-distended ?MUSCULOSKELETAL:  No edema; No deformity  ?SKIN: Warm and dry ?NEUROL

## 2021-09-29 ENCOUNTER — Encounter: Payer: Self-pay | Admitting: Cardiology

## 2021-09-29 ENCOUNTER — Other Ambulatory Visit: Payer: Self-pay

## 2021-09-29 ENCOUNTER — Ambulatory Visit: Payer: Medicare Other | Admitting: Cardiology

## 2021-09-29 VITALS — BP 128/82 | HR 72 | Ht 61.5 in | Wt 111.4 lb

## 2021-09-29 DIAGNOSIS — E782 Mixed hyperlipidemia: Secondary | ICD-10-CM

## 2021-09-29 DIAGNOSIS — I498 Other specified cardiac arrhythmias: Secondary | ICD-10-CM | POA: Diagnosis not present

## 2021-09-29 DIAGNOSIS — R55 Syncope and collapse: Secondary | ICD-10-CM | POA: Diagnosis not present

## 2021-09-29 DIAGNOSIS — R002 Palpitations: Secondary | ICD-10-CM | POA: Diagnosis not present

## 2021-09-29 DIAGNOSIS — I25118 Atherosclerotic heart disease of native coronary artery with other forms of angina pectoris: Secondary | ICD-10-CM | POA: Diagnosis not present

## 2021-09-29 NOTE — Patient Instructions (Addendum)
Medication Instructions:  ? ?Your physician recommends that you continue on your current medications as directed. Please refer to the Current Medication list given to you today. ? ?*If you need a refill on your cardiac medications before your next appointment, please call your pharmacy* ? ? ?Follow-Up: ?At [facility], you and your health needs are our priority.  As part of our continuing mission to provide you with exceptional heart care, we have created designated Provider Care Teams.  These Care Teams include your primary Cardiologist (physician) and Advanced Practice Providers (APPs -  Physician Assistants and Nurse Practitioners) who all work together to provide you with the care you need, when you need it. ? ?We recommend signing up for the patient portal called "MyChart".  Sign up information is provided on this After Visit Summary.  MyChart is used to connect with patients for Virtual Visits (Telemedicine).  Patients are able to view lab/test results, encounter notes, upcoming appointments, etc.  Non-urgent messages can be sent to your provider as well.   ?To learn more about what you can do with MyChart, go to [email or web address].   ? ?Your next appointment:   ?1 YEAR ? ?The format for your next appointment:   ?In Person ? ?Provider:   ?[name], MD { ? ? ? ?

## 2021-09-29 NOTE — Progress Notes (Signed)
?Cardiology Office Note:   ? ?Date:  09/30/2021  ? ?ID:  [name], DOB 10/08/1940, MRN [id number] ? ?PCP:  Crist Infante, MD ?  ?[place]  ?Cardiologist:  [name], MD  ?Advanced Practice Provider:  No care team member to display ?Electrophysiologist:  None  ? ? ?Referring MD: Crist Infante, MD  ? ?History of Present Illness:   ? ?[name] is a 81 y.o. female with a hx of hypothyroidism who presents to clinic for follow-up. ? ?She had episode of syncope 08/2020 in setting of quick position changed deemed orthostatic. She had CT calcium score of 49.3 all in LAD (39% for subjects in the same age, gender and race/ethnicity) recommended for statin.  She wore two cardiac monitor showing predominantly normal sinus rhythm with PACs with burden of 6% and 4%.  She was without palpitations AV nodal blocking therapy was deferred. ? ?Last saw [name] on 04/02/21 where her apple watch has been giving her notifications of Afib. She was asymptomatic at that time. Symptoms thought to be due to sinus arrhythmia and PACs.  ? ?Today, she is doing well. She reports her levothyroxine dose was recently decreased in December 2022 and she reports her palpitations improved. She is sleeping much better. Her Apple watch still occasionally alarms her of abnormal heart rhythm but this is much less frequent and she denies feeling palpitations with these alarms. She is not concerned about this. ? ?She denies chest pain, chest pressure, dyspnea at rest or with exertion, palpitations, PND, orthopnea, or leg swelling. Denies cough, fever, chills. Denies nausea, vomiting. Denies syncope or presyncope. Denies dizziness or lightheadedness. Denies snoring. ? ?Past Medical History:  ?Diagnosis Date  ? Allergy   ? Cataract   ? bilaterally removed   ? Chronic kidney disease 08/2017  ? seen in the ED- told diverticulitis but was truly kidney stones   ? Osteopenia   ? Thyroid disease   ? hypothyroid   ? ? ?Past Surgical History:  ?Procedure Laterality Date  ? BLADDER SUSPENSION    ? BREAST REDUCTION SURGERY    ? COLONOSCOPY    ? COSMETIC SURGERY    ? IRRIGATION AND DEBRIDEMENT OF WOUND WITH SPLIT THICKNESS SKIN GRAFT Bilateral 10/18/2012  ? Procedure: Evacuation of hematoma right face;  Surgeon: Renee Pain, MD;  Location: [place];  Service: Plastics;  Laterality: Bilateral;  Removal of left face stitches  ? POLYPECTOMY    ? ? ?Current Medications: ?Current Meds  ?Medication Sig  ? Calcium Citrate (CITRACAL PO) Take by mouth.  ? Cholecalciferol (VITAMIN D) 2000 UNITS CAPS Take 4,000 Units by mouth daily.   ? Coenzyme Q10 (COQ10 PO) Take by mouth.  ? COLLAGEN PO Take by mouth.  ? Cyanocobalamin (VITAMIN B12 PO) Take by mouth. 5000 mcg once a week  ? levothyroxine (SYNTHROID) 50 MCG tablet Take 50 mcg by mouth daily.  ? loperamide (IMODIUM) 2 MG capsule Take by mouth.  ? Multiple Minerals-Vitamins (CALCIUM-MAGNESIUM-ZINC-D3) TABS Take by mouth.  ? omega-3 acid ethyl esters (LOVAZA) 1 G capsule Take by mouth 2 (two) times daily.  ? Probiotic Product (PROBIOTIC DAILY PO) Take by mouth 2 (two) times daily.  ? Quercetin 500 MG CAPS   ? rosuvastatin (CRESTOR) 5 MG tablet Take 1 tablet (5 mg total) by mouth daily.  ? Triamcinolone Acetonide (NASACORT AQ NA) Place into the nose.  ? TURMERIC CURCUMIN PO Take by mouth.  ? ?Current Facility-Administered Medications for the  09/29/21 encounter (Office Visit) with [name], MD  ?Medication  ? 0.9 %  sodium chloride infusion  ?  ? ?Allergies:   Penicillins  ? ?Social History  ? ?Socioeconomic History  ? Marital status: Married  ?  Spouse name: Not on file  ? Number of children: Not on file  ? Years of education: Not on file  ? Highest education level: Not on file  ?Occupational History  ? Not on file  ?Tobacco Use  ? Smoking status: Never  ? Smokeless tobacco: Never  ?Vaping Use  ? Vaping Use: Never used  ?Substance and Sexual Activity  ? Alcohol use: Yes  ?   Comment: occ glass of wine   ? Drug use: No  ? Sexual activity: Not on file  ?Other Topics Concern  ? Not on file  ?Social History Narrative  ? Not on file  ? ?Social Determinants of Health  ? ?Financial Resource Strain: Not on file  ?Food Insecurity: Not on file  ?Transportation Needs: Not on file  ?Physical Activity: Not on file  ?Stress: Not on file  ?Social Connections: Not on file  ?  ? ?Family History: ?The patient's family history is negative for Colon cancer, Colon polyps, Esophageal cancer, Rectal cancer, and Stomach cancer. ? ?ROS:   ?Please see the history of present illness.    ?Review of Systems  ?Constitutional:  Negative for chills and fever.  ?HENT:  Negative for hearing loss.   ?Eyes:  Negative for blurred vision and redness.  ?Respiratory:  Negative for shortness of breath.   ?Cardiovascular:  Negative for chest pain, palpitations, orthopnea, claudication, leg swelling and PND.  ?Gastrointestinal:  Negative for melena, nausea and vomiting.  ?Genitourinary:  Negative for dysuria and flank pain.  ?Musculoskeletal:  Negative for myalgias.  ?Skin:  Negative for rash.  ?Neurological:  Negative for dizziness, focal weakness, seizures and loss of consciousness.  ?Endo/Heme/Allergies:  Negative for polydipsia.  ?Psychiatric/Behavioral:  Negative for substance abuse.   ? ?EKGs/Labs/Other Studies Reviewed:   ? ?The following studies were reviewed today: ?TTE 06/27/21 ? 1. Left ventricular ejection fraction, by estimation, is 70 to 75%. The left ventricle has hyperdynamic function. The left ventricle has no regional wall motion abnormalities. Left ventricular diastolic parameters were normal.  ? 2. Right ventricular systolic function is normal. The right ventricular size is normal. There is normal pulmonary artery systolic pressure. The estimated right ventricular systolic pressure is 29.5 mmHg.  ? 3. The mitral valve is grossly normal. Trivial mitral valve  ?regurgitation. No evidence of mitral stenosis.  ?  4. The aortic valve is tricuspid. There is mild calcification of the aortic valve. There is mild thickening of the aortic valve. Aortic valve regurgitation is not visualized. No aortic stenosis is present.  ? 5. The inferior vena cava is normal in size with greater than 50% respiratory variability, suggesting right atrial pressure of 3 mmHg.  ? ?Comparison(s): No prior Echocardiogram.  ? ?Conclusion(s)/Recommendation(s): Normal biventricular function without evidence of hemodynamically significant valvular heart disease.  ? ?Long term monitor 11/26/20 ?Patch wear time was 13 days and 21 hours ?Predominant rhythm was NSR with average HR 69bpm (ranging from 45-171bpm) ?There was 1 run of nonsustained VT lasting 10 beats at a rate of 171bpm ?There were 5 runs of SVT with the fastest/longest lasting 15 beats at rate 164bpm ?Frequent SVE (6.2%, 18841); rare VE (<1%) ?No afib or significant pauses ?  ?Patch Wear Time:  13 days and 21 hours (2022-05-04T11:46:05-399  to 2022-05-18T08:57:00-0400) ?  ?Patient had a min HR of 45 bpm, max HR of 171 bpm, and avg HR of 69 bpm. Predominant underlying rhythm was Sinus Rhythm. 1 run of Ventricular Tachycardia occurred lasting 10 beats with a max rate of 171 bpm (avg 153 bpm). 5 Supraventricular Tachycardia runs occurred, the run with the fastest interval lasting 15 beats with a max rate of 164 bpm (avg 137 bpm); the run with the fastest interval was also the longest. Isolated SVEs were frequent (6.2%, 93570), SVE Couplets were rare (<1.0%, 4014), and SVE Triplets were rare (<1.0%, 738). Isolated VEs were rare (<1.0%), and no VE Couplets or VE Triplets were present. Ventricular Trigeminy was present.  ?  ?Long term monitor 09/25/20 ?Patch wear time was 6 days and 22 hours ?Predominant rhythm was normal sinus rhythm with average HR 68bpm; ranging from 42-146bpm ?There were 3 episodes of SVT with the fastest/longest lasting 5 beats at HR 146bpm ?Occasional SVE (4.1%); rare PVCs ?Patient  triggered events correlated with sinus rhythm ?No Afib, significant pauses, VT or sustained arrhythmias ?Overall, no significant arrhythmias on cardiac monitor ?  ?Patch Wear Time:  6 days and 22 hours (202

## 2022-08-03 ENCOUNTER — Other Ambulatory Visit: Payer: Self-pay | Admitting: *Deleted

## 2022-08-03 DIAGNOSIS — E782 Mixed hyperlipidemia: Secondary | ICD-10-CM

## 2022-08-03 MED ORDER — ROSUVASTATIN CALCIUM 5 MG PO TABS: 5.0000 mg | ORAL_TABLET | Freq: Every day | ORAL | 3 refills | Status: DC

## 2022-10-20 NOTE — Progress Notes (Unsigned)
Cardiology Office Note:    Date:  10/22/2022   ID:  APRYL BRYMER, DOB 1941-02-22, MRN [id number]  PCP:  Rodrigo Ran, MD   [facility]  Cardiologist:  Meriam Sprague, MD  Advanced Practice Provider:  No care team member to display Electrophysiologist:  None    Referring MD: Rodrigo Ran, MD   History of Present Illness:    [name] is a 82 y.o. female with a hx of hypothyroidism who presents to clinic for follow-up.  She had episode of syncope 08/2020 in setting of quick position changed deemed orthostatic. She had CT calcium score of 49.3 all in LAD (39% for subjects in the same age, gender and race/ethnicity) recommended for statin.  She wore two cardiac monitor showing predominantly normal sinus rhythm with PACs with burden of 6% and 4%.  She was without palpitations AV nodal blocking therapy was deferred.  Saw Gillian Shields on 04/02/21 where her apple watch has been giving her notifications of Afib. She was asymptomatic at that time. Symptoms thought to be due to sinus arrhythmia and PACs.   Was last seen in clinic on 09/2021 where her palpitations had improved on lower dose synthroid.  Today, the patient overall feels well. Occasional palpitations but frequency has decreased. No chest pain, SOB, lightheadedness, LE edema, orthopnea, or PND. She remains active and walks 3.52miles/day without issues.   Past Medical History:  Diagnosis Date   Allergy    Cataract    bilaterally removed    Chronic kidney disease 08/2017   seen in the ED- told diverticulitis but was truly kidney stones    Osteopenia    Thyroid disease    hypothyroid    Past Surgical History:  Procedure Laterality Date   BLADDER SUSPENSION     BREAST REDUCTION SURGERY     COLONOSCOPY     COSMETIC SURGERY     IRRIGATION AND DEBRIDEMENT OF WOUND WITH SPLIT THICKNESS SKIN GRAFT Bilateral 10/18/2012   Procedure: Evacuation of hematoma right face;  Surgeon: Alfredia Ferguson, MD;  Location: [facility] OR;  Service: Plastics;  Laterality: Bilateral;  Removal of left face stitches   POLYPECTOMY      Current Medications: Current Meds  Medication Sig   Calcium Citrate (CITRACAL PO) Take by mouth.   Cholecalciferol (VITAMIN D) 2000 UNITS CAPS Take 4,000 Units by mouth daily.    Coenzyme Q10 (COQ10 PO) Take by mouth.   COLLAGEN PO Take by mouth.   Cyanocobalamin (VITAMIN B12 PO) Take by mouth. 5000 mcg once a week   levothyroxine (SYNTHROID) 50 MCG tablet Take 50 mcg by mouth daily.   loperamide (IMODIUM) 2 MG capsule Take by mouth.   Magnesium 200 MG TABS Take 200 mg by mouth daily.   Multiple Minerals-Vitamins (CALCIUM-MAGNESIUM-ZINC-D3) TABS Take by mouth.   omega-3 acid ethyl esters (LOVAZA) 1 G capsule Take by mouth 2 (two) times daily.   Probiotic Product (PROBIOTIC DAILY PO) Take by mouth 2 (two) times daily.   Quercetin 500 MG CAPS    rosuvastatin (CRESTOR) 5 MG tablet Take 1 tablet (5 mg total) by mouth daily.   Triamcinolone Acetonide (NASACORT AQ NA) Place into the nose.   TURMERIC CURCUMIN PO Take by mouth.   Current Facility-Administered Medications for the 10/22/22 encounter (Office Visit) with Meriam Sprague, MD  Medication   0.9 %  sodium chloride infusion     Allergies:   Penicillins   Social History   Socioeconomic History  Marital status: Married    Spouse name: Not on file   Number of children: Not on file   Years of education: Not on file   Highest education level: Not on file  Occupational History   Not on file  Tobacco Use   Smoking status: Never   Smokeless tobacco: Never  Vaping Use   Vaping Use: Never used  Substance and Sexual Activity   Alcohol use: Yes    Comment: occ glass of wine    Drug use: No   Sexual activity: Not on file  Other Topics Concern   Not on file  Social History Narrative   Not on file   Social Determinants of Health   Financial Resource Strain: Not on file  Food Insecurity: Not on  file  Transportation Needs: Not on file  Physical Activity: Not on file  Stress: Not on file  Social Connections: Not on file     Family History: The patient's family history is negative for Colon cancer, Colon polyps, Esophageal cancer, Rectal cancer, and Stomach cancer.  ROS:   Please see the history of present illness.    Review of Systems  Constitutional:  Negative for chills and fever.  HENT:  Negative for hearing loss.   Eyes:  Negative for blurred vision and redness.  Respiratory:  Negative for shortness of breath.   Cardiovascular:  Negative for chest pain, palpitations, orthopnea, claudication, leg swelling and PND.  Gastrointestinal:  Negative for melena, nausea and vomiting.  Genitourinary:  Negative for dysuria and flank pain.  Musculoskeletal:  Negative for myalgias.  Skin:  Negative for rash.  Neurological:  Negative for dizziness, focal weakness, seizures and loss of consciousness.  Endo/Heme/Allergies:  Negative for polydipsia.  Psychiatric/Behavioral:  Negative for substance abuse.     EKGs/Labs/Other Studies Reviewed:    The following studies were reviewed today: TTE 07/09/2021  1. Left ventricular ejection fraction, by estimation, is 70 to 75%. The left ventricle has hyperdynamic function. The left ventricle has no regional wall motion abnormalities. Left ventricular diastolic parameters were normal.   2. Right ventricular systolic function is normal. The right ventricular size is normal. There is normal pulmonary artery systolic pressure. The estimated right ventricular systolic pressure is 26.2 mmHg.   3. The mitral valve is grossly normal. Trivial mitral valve  regurgitation. No evidence of mitral stenosis.   4. The aortic valve is tricuspid. There is mild calcification of the aortic valve. There is mild thickening of the aortic valve. Aortic valve regurgitation is not visualized. No aortic stenosis is present.   5. The inferior vena cava is normal in size with  greater than 50% respiratory variability, suggesting right atrial pressure of 3 mmHg.   Comparison(s): No prior Echocardiogram.   Conclusion(s)/Recommendation(s): Normal biventricular function without evidence of hemodynamically significant valvular heart disease.   Long term monitor 11/26/20 Patch wear time was 13 days and 21 hours Predominant rhythm was NSR with average HR 69bpm (ranging from 45-171bpm) There was 1 run of nonsustained VT lasting 10 beats at a rate of 171bpm There were 5 runs of SVT with the fastest/longest lasting 15 beats at rate 164bpm Frequent SVE (6.2%, 83685); rare VE (<1%) No afib or significant pauses   Patch Wear Time:  13 days and 21 hours (2022-05-04T11:46:05-399 to 2022-05-18T08:57:00-0400)   Patient had a min HR of 45 bpm, max HR of 171 bpm, and avg HR of 69 bpm. Predominant underlying rhythm was Sinus Rhythm. 1 run of Ventricular Tachycardia occurred lasting  10 beats with a max rate of 171 bpm (avg 153 bpm). 5 Supraventricular Tachycardia runs occurred, the run with the fastest interval lasting 15 beats with a max rate of 164 bpm (avg 137 bpm); the run with the fastest interval was also the longest. Isolated SVEs were frequent (6.2%, 46962), SVE Couplets were rare (<1.0%, 4014), and SVE Triplets were rare (<1.0%, 738). Isolated VEs were rare (<1.0%), and no VE Couplets or VE Triplets were present. Ventricular Trigeminy was present.    Long term monitor 09/25/20 Patch wear time was 6 days and 22 hours Predominant rhythm was normal sinus rhythm with average HR 68bpm; ranging from 42-146bpm There were 3 episodes of SVT with the fastest/longest lasting 5 beats at HR 146bpm Occasional SVE (4.1%); rare PVCs Patient triggered events correlated with sinus rhythm No Afib, significant pauses, VT or sustained arrhythmias Overall, no significant arrhythmias on cardiac monitor   Patch Wear Time:  6 days and 22 hours (2022-03-10T15:41:28-0500 to 2022-03-17T15:20:20-0400)    Patient had a min HR of 43 bpm, max HR of 146 bpm, and avg HR of 68 bpm. Predominant underlying rhythm was Sinus Rhythm. 3 Supraventricular Tachycardia runs occurred, the run with the fastest interval lasting 5 beats with a max rate of 146 bpm (avg 140  bpm); the run with the fastest interval was also the longest. Isolated SVEs were occasional (4.1%, [id number]), and no SVE Couplets or SVE Triplets were present. Isolated VEs were rare (<1.0%), and no VE Couplets or VE Triplets were present.    CT calcium score 05/2020: FINDINGS: CORONARY CALCIUM SCORES:   Left Main: 0   LAD: 49.3   LCx: 0   RCA: 0   Total Agatston Score: 49.3   MESA database percentile: 39   AORTA MEASUREMENTS:   Ascending Aorta: 31 mm   Descending Aorta: 20 mm   OTHER FINDINGS:   Heart is normal size. Calcifications in the aortic root and descending thoracic aorta. Calcified mediastinal and left hilar lymph nodes. Calcified granuloma in the left lower lobe. Linear scarring in the lingula and left lower lobe at the lung base. No effusions. Imaging into the upper abdomen demonstrates no acute findings. Chest wall soft tissues are unremarkable. No acute bony abnormality.   IMPRESSION: The observed calcium score of 49.3 is at the percentile 39 for subjects of the same age, gender and race/ethnicity who are free of clinical cardiovascular disease and treated diabetes.   No acute extra cardiac abnormality.   EKG: EKG personally reviewed: NSR with sinus arrhythmias, HR 68.  Recent Labs: No results found for requested labs within last 365 days.  Recent Lipid Panel    Component Value Date/Time   CHOL 158 04/02/2021 1029   TRIG 141 04/02/2021 1029   HDL 76 04/02/2021 1029   CHOLHDL 2.1 04/02/2021 1029   LDLCALC 58 04/02/2021 1029   LDLDIRECT 65 04/02/2021 1029     Risk Assessment/Calculations:       Physical Exam:    VS:  BP 120/72   Pulse 68   Ht 5' 1.5" (1.562 m)   Wt 109 lb 6.4 oz (49.6 kg)    SpO2 97%   BMI 20.34 kg/m     Wt Readings from Last 3 Encounters:  10/22/22 109 lb 6.4 oz (49.6 kg)  09/29/21 111 lb 6.4 oz (50.5 kg)  04/02/21 115 lb (52.2 kg)     GEN:  Well nourished, well developed in no acute distress HEENT: Normal NECK: No JVD; No carotid bruits LYMPHATICS:  No lymphadenopathy CARDIAC: RRR, 1/6 systolic murmur at LUSB, no rubs or gallops RESPIRATORY:  Clear to auscultation without rales, wheezing or rhonchi  ABDOMEN: Soft, non-tender, non-distended MUSCULOSKELETAL:  No edema; No deformity  SKIN: Warm and dry NEUROLOGIC:  Alert and oriented x 3 PSYCHIATRIC:  Normal affect   ASSESSMENT:    1. Syncope and collapse   2. Palpitations   3. Sinus arrhythmia   4. Coronary artery disease of native artery of native heart with stable angina pectoris   5. Mixed hyperlipidemia   6. Hyperlipidemia LDL goal <70     PLAN:    In order of problems listed above:  #Syncope: No recurrence. Likely orthostatic in nature. Cardiac monitors with occasional PACs but no significant arrhythmias.  -Continue hydration -Not on any antihypertensives or beta blockers -Compression socks/abdominal binders as tolerated -Advised more liberal salt intake to prevent recurrence -Slow position changes -If patient feels faint, encouraged her to lay down and put her legs up  #Coronary calcification: Calcium score 49.3 all in LAD (39% for subjects of the same age, gender and race/ethnicity).  -Continue crestor 5mg  daily  -Continue lifestyle modifications with healthy diet and exercise as detailed below  #Sinus arrhythmia/PAC: Cardic monitors with PAC burden of 4% and 6% respectively. She is asymptomatic without palpitations and prefers to avoid AV nodal blocking therapy at this time. Apple watch likely detecting PACs and sinus arrhythmia. Less frequent events with recent adjustment of levothyroxine. Will continue to monitor.  #HLD: -Crestor 5mg  daily -Goal LDL<70  Exercise  recommendations: Goal of exercising for at least 30 minutes a day, at least 5 times per week.  Please exercise to a moderate exertion.  This means that while exercising it is difficult to speak in full sentences, however you are not so short of breath that you feel you must stop, and not so comfortable that you can carry on a full conversation.  Exertion level should be approximately a 5/10, if 10 is the most exertion you can perform.  Diet recommendations: Recommend a heart healthy diet such as the Mediterranean diet.  This diet consists of plant based foods, healthy fats, lean meats, olive oil.  It suggests limiting the intake of simple carbohydrates such as white breads, pastries, and pastas.  It also limits the amount of red meat, wine, and dairy products such as cheese that one should consume on a daily basis.  Medication Adjustments/Labs and Tests Ordered: Current medicines are reviewed at length with the patient today.  Concerns regarding medicines are outlined above.  No orders of the defined types were placed in this encounter.  No orders of the defined types were placed in this encounter.   Patient Instructions  Medication Instructions:   *If you need a refill on your cardiac medications before your next appointment, please call your pharmacy*   Lab Work:  If you have labs (blood work) drawn today and your tests are completely normal, you will receive your results only by: MyChart Message (if you have MyChart) OR A paper copy in the mail If you have any lab test that is abnormal or we need to change your treatment, we will call you to review the results.   Testing/Procedures:    Follow-Up: At [facility], you and your health needs are our priority.  As part of our continuing mission to provide you with exceptional heart care, we have created designated Provider Care Teams.  These Care Teams include your primary Cardiologist (physician) and Advanced Practice  Providers (APPs -  Physician Assistants and Nurse Practitioners) who all work together to provide you with the care you need, when you need it.  We recommend signing up for the patient portal called "MyChart".  Sign up information is provided on this After Visit Summary.  MyChart is used to connect with patients for Virtual Visits (Telemedicine).  Patients are able to view lab/test results, encounter notes, upcoming appointments, etc.  Non-urgent messages can be sent to your provider as well.   To learn more about what you can do with MyChart, go to [email or web address].    Your next appointment:   1 year(s)  Provider:   Meriam Sprague, MD     Other Instructions     Signed, Meriam Sprague, MD  10/22/2022 9:14 PM    [facility]

## 2022-10-22 ENCOUNTER — Ambulatory Visit: Payer: Medicare Other | Attending: Cardiology | Admitting: Cardiology

## 2022-10-22 ENCOUNTER — Encounter: Payer: Self-pay | Admitting: Cardiology

## 2022-10-22 VITALS — BP 120/72 | HR 68 | Ht 61.5 in | Wt 109.4 lb

## 2022-10-22 DIAGNOSIS — I25118 Atherosclerotic heart disease of native coronary artery with other forms of angina pectoris: Secondary | ICD-10-CM | POA: Diagnosis not present

## 2022-10-22 DIAGNOSIS — R55 Syncope and collapse: Secondary | ICD-10-CM | POA: Diagnosis not present

## 2022-10-22 DIAGNOSIS — R002 Palpitations: Secondary | ICD-10-CM | POA: Diagnosis not present

## 2022-10-22 DIAGNOSIS — E782 Mixed hyperlipidemia: Secondary | ICD-10-CM

## 2022-10-22 DIAGNOSIS — E785 Hyperlipidemia, unspecified: Secondary | ICD-10-CM

## 2022-10-22 DIAGNOSIS — I498 Other specified cardiac arrhythmias: Secondary | ICD-10-CM | POA: Diagnosis not present

## 2022-10-22 NOTE — Patient Instructions (Signed)
Medication Instructions:   *If you need a refill on your cardiac medications before your next appointment, please call your pharmacy*   Lab Work:  If you have labs (blood work) drawn today and your tests are completely normal, you will receive your results only by: MyChart Message (if you have MyChart) OR A paper copy in the mail If you have any lab test that is abnormal or we need to change your treatment, we will call you to review the results.   Testing/Procedures:    Follow-Up: At [facility], you and your health needs are our priority.  As part of our continuing mission to provide you with exceptional heart care, we have created designated Provider Care Teams.  These Care Teams include your primary Cardiologist (physician) and Advanced Practice Providers (APPs -  Physician Assistants and Nurse Practitioners) who all work together to provide you with the care you need, when you need it.  We recommend signing up for the patient portal called "MyChart".  Sign up information is provided on this After Visit Summary.  MyChart is used to connect with patients for Virtual Visits (Telemedicine).  Patients are able to view lab/test results, encounter notes, upcoming appointments, etc.  Non-urgent messages can be sent to your provider as well.   To learn more about what you can do with MyChart, go to [email or web address].    Your next appointment:   1 year(s)  Provider:   Meriam Sprague, MD     Other Instructions

## 2022-10-23 NOTE — Addendum Note (Signed)
Addended by: Ferne Reus on: 10/23/2022 03:05 PM   Modules accepted: Orders

## 2023-07-06 ENCOUNTER — Other Ambulatory Visit: Payer: Self-pay

## 2023-07-06 DIAGNOSIS — E782 Mixed hyperlipidemia: Secondary | ICD-10-CM

## 2023-07-06 MED ORDER — ROSUVASTATIN CALCIUM 5 MG PO TABS: 5.0000 mg | ORAL_TABLET | Freq: Every day | ORAL | 1 refills | Status: DC

## 2023-10-19 ENCOUNTER — Ambulatory Visit: Payer: Medicare Other | Attending: Cardiology | Admitting: Cardiology

## 2023-10-19 ENCOUNTER — Other Ambulatory Visit: Payer: Self-pay | Admitting: Cardiology

## 2023-10-19 ENCOUNTER — Encounter: Payer: Self-pay | Admitting: Cardiology

## 2023-10-19 ENCOUNTER — Encounter: Payer: Self-pay | Admitting: *Deleted

## 2023-10-19 VITALS — BP 110/80 | HR 65 | Ht 61.0 in | Wt 110.0 lb

## 2023-10-19 DIAGNOSIS — R002 Palpitations: Secondary | ICD-10-CM

## 2023-10-19 DIAGNOSIS — I480 Paroxysmal atrial fibrillation: Secondary | ICD-10-CM | POA: Diagnosis not present

## 2023-10-19 DIAGNOSIS — I2510 Atherosclerotic heart disease of native coronary artery without angina pectoris: Secondary | ICD-10-CM

## 2023-10-19 DIAGNOSIS — E785 Hyperlipidemia, unspecified: Secondary | ICD-10-CM | POA: Diagnosis not present

## 2023-10-19 NOTE — Progress Notes (Unsigned)
 Cardiology Office Note:    Date:  10/20/2023   ID:  [name], DOB 1941-04-13, MRN [id number]  PCP:  Aldo Hun, MD  Cardiologist:  Jerryl Morin, DO  Electrophysiologist:  None   Referring MD: Aldo Hun, MD   " I am concern about the atrial fibrillation"  History of Present Illness:    [name] is a 83 y.o. female with a hx of hypothyroidism, coronary artery disease, Patient reports atrial fibrillation but this not documented on previous records, Sinus arrhythmias, mixed hyperlipidemia.  Previous notes reports that her concern from the apple watch was thought to be premature atrial complexes. There is no document monitor report of afib.  She still continues to have irregular reporting on her watch.  No reported symptoms.   Today she tells me that she has been  presents with concerns about the frequency of her AFib episodes. She reports that she experiences an episode approximately once every two to three weeks. She has been managing her condition by reducing her physical activity, particularly during periods of high humidity and heat. She has also been using a watch to monitor her heart rate and has noticed irregularities. However, she is unsure when to be concerned about these episodes.    Past Medical History:  Diagnosis Date   Allergy    Cataract    bilaterally removed    Chronic kidney disease 08/2017   seen in the ED- told diverticulitis but was truly kidney stones    Osteopenia    Thyroid disease    hypothyroid    Past Surgical History:  Procedure Laterality Date   BLADDER SUSPENSION     BREAST REDUCTION SURGERY     COLONOSCOPY     COSMETIC SURGERY     IRRIGATION AND DEBRIDEMENT OF WOUND WITH SPLIT THICKNESS SKIN GRAFT Bilateral 10/18/2012   Procedure: Evacuation of hematoma right face;  Surgeon: Rice Chamorro, MD;  Location: [facility] OR;  Service: Plastics;  Laterality: Bilateral;  Removal of left face stitches   POLYPECTOMY      Current  Medications: Current Meds  Medication Sig   Calcium Citrate (CITRACAL PO) Take by mouth.   Coenzyme Q10 (COQ10 PO) Take by mouth.   COLLAGEN PO Take by mouth.   levothyroxine (SYNTHROID) 50 MCG tablet Take 50 mcg by mouth daily.   loperamide (IMODIUM) 2 MG capsule Take by mouth.   Magnesium 200 MG TABS Take 200 mg by mouth daily.   Quercetin 500 MG CAPS    rosuvastatin (CRESTOR) 5 MG tablet Take 1 tablet (5 mg total) by mouth daily.   TURMERIC CURCUMIN PO Take by mouth.   Current Facility-Administered Medications for the 10/19/23 encounter (Office Visit) with Harjit Douds, DO  Medication   0.9 %  sodium chloride infusion     Allergies:   Penicillins   Social History   Socioeconomic History   Marital status: Married    Spouse name: Not on file   Number of children: Not on file   Years of education: Not on file   Highest education level: Not on file  Occupational History   Not on file  Tobacco Use   Smoking status: Never   Smokeless tobacco: Never  Vaping Use   Vaping status: Never Used  Substance and Sexual Activity   Alcohol use: Yes    Comment: occ glass of wine    Drug use: No   Sexual activity: Not on file  Other Topics Concern   Not on  file  Social History Narrative   Not on file   Social Drivers of Health   Financial Resource Strain: Not on file  Food Insecurity: Not on file  Transportation Needs: Not on file  Physical Activity: Not on file  Stress: Not on file  Social Connections: Not on file     Family History: The patient's family history is negative for Colon cancer, Colon polyps, Esophageal cancer, Rectal cancer, and Stomach cancer.  ROS:   Review of Systems  Constitution: Negative for decreased appetite, fever and weight gain.  HENT: Negative for congestion, ear discharge, hoarse voice and sore throat.   Eyes: Negative for discharge, redness, vision loss in right eye and visual halos.  Cardiovascular: Negative for chest pain, dyspnea on  exertion, leg swelling, orthopnea and palpitations.  Respiratory: Negative for cough, hemoptysis, shortness of breath and snoring.   Endocrine: Negative for heat intolerance and polyphagia.  Hematologic/Lymphatic: Negative for bleeding problem. Does not bruise/bleed easily.  Skin: Negative for flushing, nail changes, rash and suspicious lesions.  Musculoskeletal: Negative for arthritis, joint pain, muscle cramps, myalgias, neck pain and stiffness.  Gastrointestinal: Negative for abdominal pain, bowel incontinence, diarrhea and excessive appetite.  Genitourinary: Negative for decreased libido, genital sores and incomplete emptying.  Neurological: Negative for brief paralysis, focal weakness, headaches and loss of balance.  Psychiatric/Behavioral: Negative for altered mental status, depression and suicidal ideas.  Allergic/Immunologic: Negative for HIV exposure and persistent infections.    EKGs/Labs/Other Studies Reviewed:    The following studies were reviewed today:   EKG:  The ekg ordered today demonstrates sinus rhythm with arrhythmia  Recent Labs: No results found for requested labs within last 365 days.  Recent Lipid Panel    Component Value Date/Time   CHOL 158 04/02/2021 1029   TRIG 141 04/02/2021 1029   HDL 76 04/02/2021 1029   CHOLHDL 2.1 04/02/2021 1029   LDLCALC 58 04/02/2021 1029   LDLDIRECT 65 04/02/2021 1029    Physical Exam:    VS:  BP 110/80 (BP Location: Right Arm, Patient Position: Sitting, Cuff Size: Normal)   Pulse 65   Ht 5\' 1"  (1.549 m)   Wt 110 lb (49.9 kg)   SpO2 96%   BMI 20.78 kg/m     Wt Readings from Last 3 Encounters:  10/19/23 110 lb (49.9 kg)  10/22/22 109 lb 6.4 oz (49.6 kg)  09/29/21 111 lb 6.4 oz (50.5 kg)     GEN: Well nourished, well developed in no acute distress HEENT: Normal NECK: No JVD; No carotid bruits LYMPHATICS: No lymphadenopathy CARDIAC: S1S2 noted,RRR, no murmurs, rubs, gallops RESPIRATORY:  Clear to auscultation  without rales, wheezing or rhonchi  ABDOMEN: Soft, non-tender, non-distended, +bowel sounds, no guarding. EXTREMITIES: No edema, No cyanosis, no clubbing MUSCULOSKELETAL:  No deformity  SKIN: Warm and dry NEUROLOGIC:  Alert and oriented x 3, non-focal PSYCHIATRIC:  Normal affect, good insight  ASSESSMENT:    1. Palpitations   2. PAF (paroxysmal atrial fibrillation) (HCC)   3. Coronary artery calcification seen on CAT scan   4. Hyperlipidemia LDL goal <70    PLAN:    Concerns for Afib -no documented Afib I can appreciate. Previous monitor PACs. She is worried about the more frequent episodes of irregular heart rate. She will benefit from an ambulatory monitor because if she has Afib we need to talk about treatment: rhythm control, rate control and stroke prevention. CHADS-VASc score is 3, indicating elevated stroke risk. Previous heart monitor did not show AFib; her  applewatch is misinterpreting PACs as AFib. Confirm AFib burden before anticoagulation. Eliquis considered due to weight and age; Xarelto not recommended due to GI bleeding risk. - Order 30-day heart monitor to assess AFib burden.   CAD - no angina symptoms   HLN - on crestor 5 mg daily. Last LDL , 70   - Schedule follow-up in 16 weeks to review results and discuss management.  The patient is in agreement with the above plan. The patient left the office in stable condition.  The patient will follow up in   Medication Adjustments/Labs and Tests Ordered: Current medicines are reviewed at length with the patient today.  Concerns regarding medicines are outlined above.  Orders Placed This Encounter  Procedures   EKG 12-Lead   No orders of the defined types were placed in this encounter.   Patient Instructions  Medication Instructions:  Your physician recommends that you continue on your current medications as directed. Please refer to the Current Medication list given to you today.  *If you need a refill on your  cardiac medications before your next appointment, please call your pharmacy*  Testing/Procedures: [employer] Cardiac Event Monitor Instructions  Your physician has requested you wear your cardiac event monitor for 30 days. [employer] may call or text to confirm a shipping address. The monitor will be sent to a land address via UPS. [employer] will not ship a monitor to a PO BOX. It typically takes 3-5 days to receive your monitor after it has been enrolled. [employer] will assist with USPS tracking if your package is delayed. The telephone number for [employer] is [phone]. Once you have received your monitor, please review the enclosed instructions. Instruction tutorials can also be viewed under help and settings on the enclosed cell phone. Your monitor has already been registered assigning a specific monitor serial # to you.  Billing and Self Pay Discount Information  [employer] has been provided the insurance information we had on file for you.  If your insurance has been updated, please call [employer] at [phone] to provide them with your updated insurance information.   [employer] offers a discounted Self Pay option for patients who have insurance that does not cover their cardiac event monitor or patients without insurance.  The discounted cost of a Self Pay Cardiac Event Monitor would be $225.00 , if the patient contacts [employer] at [phone] within 7 days of applying the monitor to make payment arrangements.  If the patient does not contact [employer] within 7 days of applying the monitor, the cost of the cardiac event monitor will be $350.00.  Applying the monitor  Remove cell phone from case and turn it on. The cell phone works as IT consultant and needs to be within [employer] of you at all times. The cell phone will need to be charged on a daily basis. We recommend you plug the cell phone into the enclosed charger at your bedside table every night.  Monitor  batteries: You will receive two monitor batteries labelled #1 and #2. These are your recorders. Plug battery #2 onto the second connection on the enclosed charger. Keep one battery on the charger at all times. This will keep the monitor battery deactivated. It will also keep it fully charged for when you need to switch your monitor batteries. A small light will be blinking on the battery emblem when it is charging. The light on the battery emblem will remain on when the battery is fully charged.  Open package of a Monitor strip.  Insert battery #1 into black hood on strip and gently squeeze monitor battery onto connection as indicated in instruction booklet. Set aside while preparing skin.  Choose location for your strip, vertical or horizontal, as indicated in the instruction booklet. Shave to remove all hair from location. There cannot be any lotions, oils, powders, or colognes on skin where monitor is to be applied. Wipe skin clean with enclosed Saline wipe. Dry skin completely.  Peel paper labeled #1 off the back of the Monitor strip exposing the adhesive. Place the monitor on the chest in the vertical or horizontal position shown in the instruction booklet. One arrow on the monitor strip must be pointing upward. Carefully remove paper labeled #2, attaching remainder of strip to your skin. Try not to create any folds or wrinkles in the strip as you apply it.  Firmly press and release the circle in the center of the monitor battery. You will hear a small beep. This is turning the monitor battery on. The heart emblem on the monitor battery will light up every 5 seconds if the monitor battery in turned on and connected to the patient securely. Do not push and hold the circle down as this turns the monitor battery off. The cell phone will locate the monitor battery. A screen will appear on the cell phone checking the connection of your monitor strip. This may read poor connection initially but  change to good connection within the next minute. Once your monitor accepts the connection you will hear a series of 3 beeps followed by a climbing crescendo of beeps. A screen will appear on the cell phone showing the two monitor strip placement options. Touch the picture that demonstrates where you applied the monitor strip.  Your monitor strip and battery are waterproof. You are able to shower, bathe, or swim with the monitor on. They just ask you do not submerge deeper than 3 feet underwater. We recommend removing the monitor if you are swimming in a lake, river, or ocean.  Your monitor battery will need to be switched to a fully charged monitor battery approximately once a week. The cell phone will alert you of an action which needs to be made.  On the cell phone, tap for details to reveal connection status, monitor battery status, and cell phone battery status. The green dots indicates your monitor is in good status. A red dot indicates there is something that needs your attention.  To record a symptom, click the circle on the monitor battery. In 30-60 seconds a list of symptoms will appear on the cell phone. Select your symptom and tap save. Your monitor will record a sustained or significant arrhythmia regardless of you clicking the button. Some patients do not feel the heart rhythm irregularities. [employer] will notify us of any serious or critical events.  Refer to instruction booklet for instructions on switching batteries, changing strips, the Do not disturb or Pause features, or any additional questions.  Call [employer] at [phone], to confirm your monitor is transmitting and record your baseline. They will answer any questions you may have regarding the monitor instructions at that time.  Returning the monitor to [employer]  Place all equipment back into blue box. Peel off strip of paper to expose adhesive and close box securely. There is a prepaid UPS shipping  label on this box. Drop in a UPS drop box, or at a UPS facility like [employer]. You may also contact [employer] to arrange UPS to pick up monitor package at  your home.   Follow-Up: At [facility], you and your health needs are our priority.  As part of our continuing mission to provide you with exceptional heart care, our providers are all part of one team.  This team includes your primary Cardiologist (physician) and Advanced Practice Providers or APPs (Physician Assistants and Nurse Practitioners) who all work together to provide you with the care you need, when you need it.  Your next appointment:   16 week(s)  Provider:   Thomasene Ripple, DO     Other Instructions:   1st Floor: - Lobby - Registration  - Pharmacy  - Lab - Cafe  2nd Floor: - PV Lab - Diagnostic Testing (echo, CT, nuclear med)  3rd Floor: - Vacant  4th Floor: - TCTS (cardiothoracic surgery) - AFib Clinic - Structural Heart Clinic - Vascular Surgery  - Vascular Ultrasound  5th Floor: - HeartCare Cardiology (general and EP) - Clinical Pharmacy for coumadin, hypertension, lipid, weight-loss medications, and med management appointments    Valet parking services will be available as well.      Adopting a Healthy Lifestyle.  Know what a healthy weight is for you (roughly BMI <25) and aim to maintain this   Aim for 7+ servings of fruits and vegetables daily   65-80+ fluid ounces of water or unsweet tea for healthy kidneys   Limit to max 1 drink of alcohol per day; avoid smoking/tobacco   Limit animal fats in diet for cholesterol and heart health - choose grass fed whenever available   Avoid highly processed foods, and foods high in saturated/trans fats   Aim for low stress - take time to unwind and care for your mental health   Aim for 150 min of moderate intensity exercise weekly for heart health, and weights twice weekly for bone health   Aim for 7-9 hours of sleep daily   When it  comes to diets, agreement about the perfect plan isnt easy to find, even among the experts. Experts at the [facility] [employer] developed an idea known as the Healthy Eating Plate. Just imagine a plate divided into logical, healthy portions.   The emphasis is on diet quality:   Load up on vegetables and fruits - one-half of your plate: Aim for color and variety, and remember that potatoes dont count.   Go for whole grains - one-quarter of your plate: Whole wheat, barley, wheat berries, quinoa, oats, brown rice, and foods made with them. If you want pasta, go with whole wheat pasta.   Protein power - one-quarter of your plate: Fish, chicken, beans, and nuts are all healthy, versatile protein sources. Limit red meat.   The diet, however, does go beyond the plate, offering a few other suggestions.   Use healthy plant oils, such as olive, canola, soy, corn, sunflower and peanut. Check the labels, and avoid partially hydrogenated oil, which have unhealthy trans fats.   If youre thirsty, drink water. Coffee and tea are good in moderation, but skip sugary drinks and limit milk and dairy products to one or two daily servings.   The type of carbohydrate in the diet is more important than the amount. Some sources of carbohydrates, such as vegetables, fruits, whole grains, and beans-are healthier than others.   Finally, stay active  Signed, Thomasene Ripple, DO  10/20/2023 4:37 PM    [facility]

## 2023-10-19 NOTE — Progress Notes (Signed)
 Patient enrolled for [employer] / [employer] to ship a 30 day cardiac event monitor to her address on file.

## 2023-10-19 NOTE — Patient Instructions (Signed)
 Medication Instructions:  Your physician recommends that you continue on your current medications as directed. Please refer to the Current Medication list given to you today.  *If you need a refill on your cardiac medications before your next appointment, please call your pharmacy*  Testing/Procedures: [employer] Cardiac Event Monitor Instructions  Your physician has requested you wear your cardiac event monitor for 30 days. [employer] may call or text to confirm a shipping address. The monitor will be sent to a land address via UPS. [employer] will not ship a monitor to a PO BOX. It typically takes 3-5 days to receive your monitor after it has been enrolled. [employer] will assist with USPS tracking if your package is delayed. The telephone number for [employer] is [phone]. Once you have received your monitor, please review the enclosed instructions. Instruction tutorials can also be viewed under help and settings on the enclosed cell phone. Your monitor has already been registered assigning a specific monitor serial # to you.  Billing and Self Pay Discount Information  [employer] has been provided the insurance information we had on file for you.  If your insurance has been updated, please call [employer] at [phone] to provide them with your updated insurance information.   [employer] offers a discounted Self Pay option for patients who have insurance that does not cover their cardiac event monitor or patients without insurance.  The discounted cost of a Self Pay Cardiac Event Monitor would be $225.00 , if the patient contacts [employer] at [phone] within 7 days of applying the monitor to make payment arrangements.  If the patient does not contact [employer] within 7 days of applying the monitor, the cost of the cardiac event monitor will be $350.00.  Applying the monitor  Remove cell phone from case and turn it on. The cell phone works as IT consultant and needs to be  within [employer] of you at all times. The cell phone will need to be charged on a daily basis. We recommend you plug the cell phone into the enclosed charger at your bedside table every night.  Monitor batteries: You will receive two monitor batteries labelled #1 and #2. These are your recorders. Plug battery #2 onto the second connection on the enclosed charger. Keep one battery on the charger at all times. This will keep the monitor battery deactivated. It will also keep it fully charged for when you need to switch your monitor batteries. A small light will be blinking on the battery emblem when it is charging. The light on the battery emblem will remain on when the battery is fully charged.  Open package of a Monitor strip. Insert battery #1 into black hood on strip and gently squeeze monitor battery onto connection as indicated in instruction booklet. Set aside while preparing skin.  Choose location for your strip, vertical or horizontal, as indicated in the instruction booklet. Shave to remove all hair from location. There cannot be any lotions, oils, powders, or colognes on skin where monitor is to be applied. Wipe skin clean with enclosed Saline wipe. Dry skin completely.  Peel paper labeled #1 off the back of the Monitor strip exposing the adhesive. Place the monitor on the chest in the vertical or horizontal position shown in the instruction booklet. One arrow on the monitor strip must be pointing upward. Carefully remove paper labeled #2, attaching remainder of strip to your skin. Try not to create any folds or wrinkles in the strip as you apply it.  Firmly press and release  the circle in the center of the monitor battery. You will hear a small beep. This is turning the monitor battery on. The heart emblem on the monitor battery will light up every 5 seconds if the monitor battery in turned on and connected to the patient securely. Do not push and hold the circle down as this turns  the monitor battery off. The cell phone will locate the monitor battery. A screen will appear on the cell phone checking the connection of your monitor strip. This may read poor connection initially but change to good connection within the next minute. Once your monitor accepts the connection you will hear a series of 3 beeps followed by a climbing crescendo of beeps. A screen will appear on the cell phone showing the two monitor strip placement options. Touch the picture that demonstrates where you applied the monitor strip.  Your monitor strip and battery are waterproof. You are able to shower, bathe, or swim with the monitor on. They just ask you do not submerge deeper than 3 feet underwater. We recommend removing the monitor if you are swimming in a lake, river, or ocean.  Your monitor battery will need to be switched to a fully charged monitor battery approximately once a week. The cell phone will alert you of an action which needs to be made.  On the cell phone, tap for details to reveal connection status, monitor battery status, and cell phone battery status. The green dots indicates your monitor is in good status. A red dot indicates there is something that needs your attention.  To record a symptom, click the circle on the monitor battery. In 30-60 seconds a list of symptoms will appear on the cell phone. Select your symptom and tap save. Your monitor will record a sustained or significant arrhythmia regardless of you clicking the button. Some patients do not feel the heart rhythm irregularities. [employer] will notify us of any serious or critical events.  Refer to instruction booklet for instructions on switching batteries, changing strips, the Do not disturb or Pause features, or any additional questions.  Call [employer] at [phone], to confirm your monitor is transmitting and record your baseline. They will answer any questions you may have regarding the monitor  instructions at that time.  Returning the monitor to [employer]  Place all equipment back into blue box. Peel off strip of paper to expose adhesive and close box securely. There is a prepaid UPS shipping label on this box. Drop in a UPS drop box, or at a UPS facility like [employer]. You may also contact [employer] to arrange UPS to pick up monitor package at your home.   Follow-Up: At [facility], you and your health needs are our priority.  As part of our continuing mission to provide you with exceptional heart care, our providers are all part of one team.  This team includes your primary Cardiologist (physician) and Advanced Practice Providers or APPs (Physician Assistants and Nurse Practitioners) who all work together to provide you with the care you need, when you need it.  Your next appointment:   16 week(s)  Provider:   Thomasene Ripple, DO     Other Instructions:   1st Floor: - Lobby - Registration  - Pharmacy  - Lab - Cafe  2nd Floor: - PV Lab - Diagnostic Testing (echo, CT, nuclear med)  3rd Floor: - Vacant  4th Floor: - TCTS (cardiothoracic surgery) - AFib Clinic - Structural Heart Clinic - Vascular Surgery  -  Vascular Ultrasound  5th Floor: - [facility] (general and EP) - Clinical Pharmacy for coumadin, hypertension, lipid, weight-loss medications, and med management appointments    Valet parking services will be available as well.

## 2023-11-23 ENCOUNTER — Ambulatory Visit: Attending: Cardiology

## 2023-11-23 DIAGNOSIS — I480 Paroxysmal atrial fibrillation: Secondary | ICD-10-CM

## 2023-11-23 DIAGNOSIS — R002 Palpitations: Secondary | ICD-10-CM

## 2023-11-30 DIAGNOSIS — R002 Palpitations: Secondary | ICD-10-CM | POA: Diagnosis not present

## 2023-11-30 DIAGNOSIS — I480 Paroxysmal atrial fibrillation: Secondary | ICD-10-CM | POA: Diagnosis not present

## 2023-12-18 ENCOUNTER — Ambulatory Visit: Payer: Self-pay | Admitting: Cardiology

## 2023-12-28 ENCOUNTER — Encounter: Payer: Self-pay | Admitting: Family Medicine

## 2023-12-28 ENCOUNTER — Ambulatory Visit: Admitting: Family Medicine

## 2023-12-28 ENCOUNTER — Ambulatory Visit
Admission: RE | Admit: 2023-12-28 | Discharge: 2023-12-28 | Disposition: A | Discharge status: Home / Self Care | Source: Ambulatory Visit | Attending: Family Medicine | Admitting: Family Medicine

## 2023-12-28 VITALS — BP 124/70 | Ht 61.5 in | Wt 108.0 lb

## 2023-12-28 DIAGNOSIS — M25552 Pain in left hip: Secondary | ICD-10-CM

## 2023-12-28 NOTE — Patient Instructions (Addendum)
 You have left hip pain that may be due to arthritis or soft tissue irritation  I placed an order for you to get x-rays of your left hip and back to assess for arthritis.  Get this done at [facility].  I will reach out within 5 to 7 days with the results.  I placed a referral for you to start physical therapy at [facility].  You should call them to schedule.  I recommend that you purchase over-the-counter Voltaren gel.  You can apply this to the left hip every 6-8 hours as needed.  This is a topical NSAID, similar to a ibuprofen cream.  You can use Salonpas lidocaine  patches as needed to the area as well.  You can continue turmeric as you are doing.  You should follow-up with me in 6 to 8 weeks if you are still having pain.  Do not hesitate to reach out with any questions or concerns.

## 2023-12-28 NOTE — Progress Notes (Signed)
 DATE OF VISIT: 12/28/2023        [name] DOB: 02-21-1941 MRN: [id number]  CC:  Lt hip/buttock pain  History- [name] is a 83 y.o.  female for evaluation and treatment of acute on chronic left hip pain Acute on chronic left-sided lateral hip pain Started over 3 years ago, she recalls increasing her walking due to a osteoporosis diagnosis - Was walking up to 10-15,000 steps a day Pain along the left lateral hip, sometimes radiates down the thigh No associated numbness or tingling Denies any specific injury or trauma Occasional anterior hip pain Has been doing deep tissue massage which has been helpful, but pain still returns Has been bothering her at night Taking Aleve at bedtime a few times a week Taking turmeric daily Has been using fish oil intermittently She has not had any imaging of the hip or the back Still walking 7-8,000 steps a day   Past Medical History Past Medical History:  Diagnosis Date   Allergy    Cataract    bilaterally removed    Chronic kidney disease 08/2017   seen in the ED- told diverticulitis but was truly kidney stones    Osteopenia    Thyroid disease    hypothyroid    Past Surgical History Past Surgical History:  Procedure Laterality Date   BLADDER SUSPENSION     BREAST REDUCTION SURGERY     COLONOSCOPY     COSMETIC SURGERY     IRRIGATION AND DEBRIDEMENT OF WOUND WITH SPLIT THICKNESS SKIN GRAFT Bilateral 10/18/2012   Procedure: Evacuation of hematoma right face;  Surgeon: LELON Cheryal Muzzy, MD;  Location: [facility] OR;  Service: Plastics;  Laterality: Bilateral;  Removal of left face stitches   POLYPECTOMY      Medications Current Outpatient Medications  Medication Sig Dispense Refill   Calcium  Citrate (CITRACAL PO) Take by mouth.     Cholecalciferol (VITAMIN D) 2000 UNITS CAPS Take 4,000 Units by mouth daily.  (Patient not taking: Reported on 10/19/2023)     Coenzyme Q10 (COQ10 PO) Take by mouth.     COLLAGEN PO Take by mouth.      Cyanocobalamin (VITAMIN B12 PO) Take by mouth. 5000 mcg once a week (Patient not taking: Reported on 10/19/2023)     levothyroxine (SYNTHROID) 50 MCG tablet Take 50 mcg by mouth daily.     loperamide (IMODIUM) 2 MG capsule Take by mouth.     Magnesium 200 MG TABS Take 200 mg by mouth daily.     Multiple Minerals-Vitamins (CALCIUM -MAGNESIUM-ZINC-D3) TABS Take by mouth. (Patient not taking: Reported on 10/19/2023)     omega-3 acid ethyl esters (LOVAZA) 1 G capsule Take by mouth 2 (two) times daily. (Patient not taking: Reported on 10/19/2023)     Probiotic Product (PROBIOTIC DAILY PO) Take by mouth 2 (two) times daily.     Quercetin 500 MG CAPS      rosuvastatin  (CRESTOR ) 5 MG tablet Take 1 tablet (5 mg total) by mouth daily. 90 tablet 1   Triamcinolone Acetonide (NASACORT AQ NA) Place into the nose. (Patient not taking: Reported on 10/19/2023)     TURMERIC CURCUMIN PO Take by mouth.     Current Facility-Administered Medications  Medication Dose Route Frequency Provider Last Rate Last Admin   0.9 %  sodium chloride  infusion  500 mL Intravenous Once Nandigam, Kavitha V, MD        Allergies is allergic to penicillins.  Family History - reviewed per EMR and intake form  Social History   reports current alcohol use.  reports that she has never smoked. She has never used smokeless tobacco.  reports no history of drug use.   EXAM: Vitals: BP 124/70   Ht 5' 1.5 (1.562 m)   Wt 108 lb (49 kg)   BMI 20.08 kg/m  General: AOx3, NAD, pleasant SKIN: no rashes or lesions, skin clean, dry, intact MSK: L-spine: No gross deformity.  No midline or paraspinal tenderness.  No SI joint tenderness.  Negative straight leg raise bilaterally, tight hamstrings bilaterally.  Negative FABER bilaterally. Hip: Left hip with full range of motion without pain.  Tender palpation over the greater trochanter.  Mild tenderness over the anterior hip in the piriformis.  Hip strength 4/5 throughout.  Right hip with full  range of motion without pain.  Minimal tenderness over the greater trochanter.  No tenderness over the anterior hip or the piriformis.  Hip strength 5 -/5 throughout. No leg length difference Walking without a limp  NEURO: sensation intact to light touch, DTR + 2/4 Achilles and patella bilaterally VASC: pulses 2+ and symmetric DP/PT bilaterally, no edema   Assessment & Plan Left hip pain Acute on chronic left lateral hip pain, ongoing for several years, but acutely worsening without any new or trauma.  Differential to include osteoarthritis, greater trochanteric pain syndrome, other etiology  Plan: - Imaging: Will obtain L-spine and hip/pelvis x-rays to rule out osteoarthritis - Recommend physical therapy, referral to [facility] near her home given - Recommend over-the-counter Voltaren gel applied to the area 6 to 8 hours as needed - She can continue her turmeric as she is doing - Discussed using Salonpas lidocaine  patches as needed - Heat or ice as needed - Follow-up 6 to 8 weeks for reevaluation, sooner as needed.  Could consider advanced imaging or possible injection therapy if symptoms persist  Patient expressed understanding & agreement with above.  Encounter Diagnosis  Name Primary?   Left hip pain Yes    Orders Placed This Encounter  Procedures   DG HIP UNILAT WITH PELVIS 2-3 VIEWS LEFT   DG Lumbar Spine 2-3 Views    Orders Placed This Encounter  Procedures   DG HIP UNILAT WITH PELVIS 2-3 VIEWS LEFT   DG Lumbar Spine 2-3 Views

## 2023-12-29 ENCOUNTER — Other Ambulatory Visit: Payer: Self-pay | Admitting: Cardiology

## 2023-12-29 DIAGNOSIS — E782 Mixed hyperlipidemia: Secondary | ICD-10-CM

## 2023-12-31 ENCOUNTER — Telehealth: Payer: Self-pay | Admitting: Cardiology

## 2023-12-31 DIAGNOSIS — E782 Mixed hyperlipidemia: Secondary | ICD-10-CM

## 2023-12-31 MED ORDER — ROSUVASTATIN CALCIUM 5 MG PO TABS: 5.0000 mg | ORAL_TABLET | Freq: Every day | ORAL | 2 refills | Status: AC

## 2023-12-31 NOTE — Telephone Encounter (Signed)
 Pt's medication was sent to pt's pharmacy as requested. Confirmation received.

## 2023-12-31 NOTE — Telephone Encounter (Signed)
*  STAT* If patient is at the pharmacy, call can be transferred to refill team.   1. Which medications need to be refilled? (please list name of each medication and dose if known)   rosuvastatin  (CRESTOR ) 5 MG tablet    2. Which pharmacy/location (including street and city if local pharmacy) is medication to be sent to?  [id number] - [place], [place] - [address]      3. Do they need a 30 day or 90 day supply? 90 day    Pt was last seen on 10/19/2023

## 2024-01-03 ENCOUNTER — Ambulatory Visit: Payer: Self-pay | Admitting: Family Medicine

## 2024-01-03 NOTE — Progress Notes (Signed)
 Xrays reviewed, MyChart message sent.

## 2024-02-11 ENCOUNTER — Ambulatory Visit: Admitting: Cardiology

## 2024-02-11 ENCOUNTER — Encounter: Payer: Self-pay | Admitting: Cardiology

## 2024-02-11 VITALS — BP 120/70 | HR 74 | Ht 61.0 in | Wt 106.3 lb

## 2024-02-11 DIAGNOSIS — I491 Atrial premature depolarization: Secondary | ICD-10-CM | POA: Diagnosis not present

## 2024-02-11 DIAGNOSIS — I2510 Atherosclerotic heart disease of native coronary artery without angina pectoris: Secondary | ICD-10-CM

## 2024-02-11 DIAGNOSIS — E785 Hyperlipidemia, unspecified: Secondary | ICD-10-CM

## 2024-02-11 NOTE — Patient Instructions (Signed)

## 2024-02-11 NOTE — Progress Notes (Signed)
 Cardiology Office Note:    Date:  02/11/2024   ID:  [name], DOB 04/09/1941, MRN [id number]  PCP:  Shayne Anes, MD  Cardiologist:  Dub Huntsman, DO  Electrophysiologist:  None   Referring MD: Shayne Anes, MD    I am concern about the atrial fibrillation  History of Present Illness:    [name] is a 83 y.o. female with a hx of hypothyroidism, coronary artery disease, Patient reports atrial fibrillation but this not documented on previous records, Sinus arrhythmias, mixed hyperlipidemia.  At her visit with me I placed a monitor on the patient which showed evidence of frequent PACS.  Her heart monitor test in 2022 showed 6.2% skipped beats, with the most recent test indicating 7% skipped beats. She experiences no symptoms such as atrial fibrillation notifications, shortness of breath, fatigue, leg swelling, or dizziness. Her diet is clean with minimal caffeine intake, limited to a half cup of half-and-half coffee in the morning.   Past Medical History:  Diagnosis Date   Allergy    Cataract    bilaterally removed    Chronic kidney disease 08/2017   seen in the ED- told diverticulitis but was truly kidney stones    Osteopenia    Thyroid disease    hypothyroid    Past Surgical History:  Procedure Laterality Date   BLADDER SUSPENSION     BREAST REDUCTION SURGERY     COLONOSCOPY     COSMETIC SURGERY     IRRIGATION AND DEBRIDEMENT OF WOUND WITH SPLIT THICKNESS SKIN GRAFT Bilateral 10/18/2012   Procedure: Evacuation of hematoma right face;  Surgeon: LELON Cheryal Muzzy, MD;  Location: [facility] OR;  Service: Plastics;  Laterality: Bilateral;  Removal of left face stitches   POLYPECTOMY      Current Medications: Current Meds  Medication Sig   Calcium  Citrate (CITRACAL PO) Take by mouth.   Cholecalciferol (VITAMIN D) 2000 UNITS CAPS Take 4,000 Units by mouth daily.    Coenzyme Q10 (COQ10 PO) Take by mouth.   COLLAGEN PO Take by mouth.   levothyroxine (SYNTHROID) 50  MCG tablet Take 50 mcg by mouth daily.   loperamide (IMODIUM) 2 MG capsule Take by mouth.   Magnesium 200 MG TABS Take 200 mg by mouth daily.   Multiple Minerals-Vitamins (CALCIUM -MAGNESIUM-ZINC-D3) TABS Take by mouth.   Quercetin 500 MG CAPS    rosuvastatin  (CRESTOR ) 5 MG tablet Take 1 tablet (5 mg total) by mouth daily.   Triamcinolone Acetonide (NASACORT AQ NA) Place into the nose.   TURMERIC CURCUMIN PO Take by mouth.   Current Facility-Administered Medications for the 02/11/24 encounter (Office Visit) with Loukisha Gunnerson, DO  Medication   0.9 %  sodium chloride  infusion     Allergies:   Penicillins   Social History   Socioeconomic History   Marital status: Married    Spouse name: Not on file   Number of children: Not on file   Years of education: Not on file   Highest education level: Not on file  Occupational History   Not on file  Tobacco Use   Smoking status: Never   Smokeless tobacco: Never  Vaping Use   Vaping status: Never Used  Substance and Sexual Activity   Alcohol use: Yes    Comment: occ glass of wine    Drug use: No   Sexual activity: Not on file  Other Topics Concern   Not on file  Social History Narrative   Not on file   Social  Drivers of Corporate investment banker Strain: Not on file  Food Insecurity: Not on file  Transportation Needs: Not on file  Physical Activity: Not on file  Stress: Not on file  Social Connections: Not on file     Family History: The patient's family history is negative for Colon cancer, Colon polyps, Esophageal cancer, Rectal cancer, and Stomach cancer.  ROS:   Review of Systems  Constitution: Negative for decreased appetite, fever and weight gain.  HENT: Negative for congestion, ear discharge, hoarse voice and sore throat.   Eyes: Negative for discharge, redness, vision loss in right eye and visual halos.  Cardiovascular: Negative for chest pain, dyspnea on exertion, leg swelling, orthopnea and palpitations.   Respiratory: Negative for cough, hemoptysis, shortness of breath and snoring.   Endocrine: Negative for heat intolerance and polyphagia.  Hematologic/Lymphatic: Negative for bleeding problem. Does not bruise/bleed easily.  Skin: Negative for flushing, nail changes, rash and suspicious lesions.  Musculoskeletal: Negative for arthritis, joint pain, muscle cramps, myalgias, neck pain and stiffness.  Gastrointestinal: Negative for abdominal pain, bowel incontinence, diarrhea and excessive appetite.  Genitourinary: Negative for decreased libido, genital sores and incomplete emptying.  Neurological: Negative for brief paralysis, focal weakness, headaches and loss of balance.  Psychiatric/Behavioral: Negative for altered mental status, depression and suicidal ideas.  Allergic/Immunologic: Negative for HIV exposure and persistent infections.    EKGs/Labs/Other Studies Reviewed:    The following studies were reviewed today:   EKG:  The ekg ordered today demonstrates sinus rhythm with arrhythmia  Recent Labs: No results found for requested labs within last 365 days.  Recent Lipid Panel    Component Value Date/Time   CHOL 158 04/02/2021 1029   TRIG 141 04/02/2021 1029   HDL 76 04/02/2021 1029   CHOLHDL 2.1 04/02/2021 1029   LDLCALC 58 04/02/2021 1029   LDLDIRECT 65 04/02/2021 1029    Physical Exam:    VS:  BP 120/70 (BP Location: Left Arm, Patient Position: Sitting, Cuff Size: Normal)   Pulse 74   Ht 5' 1 (1.549 m)   Wt 106 lb 4.8 oz (48.2 kg)   SpO2 95%   BMI 20.09 kg/m     Wt Readings from Last 3 Encounters:  02/11/24 106 lb 4.8 oz (48.2 kg)  12/28/23 108 lb (49 kg)  10/19/23 110 lb (49.9 kg)     GEN: Well nourished, well developed in no acute distress HEENT: Normal NECK: No JVD; No carotid bruits LYMPHATICS: No lymphadenopathy CARDIAC: S1S2 noted,RRR, no murmurs, rubs, gallops RESPIRATORY:  Clear to auscultation without rales, wheezing or rhonchi  ABDOMEN: Soft,  non-tender, non-distended, +bowel sounds, no guarding. EXTREMITIES: No edema, No cyanosis, no clubbing MUSCULOSKELETAL:  No deformity  SKIN: Warm and dry NEUROLOGIC:  Alert and oriented x 3, non-focal PSYCHIATRIC:  Normal affect, good insight  ASSESSMENT:    1. PAC (premature atrial contraction)   2. Coronary artery disease involving native coronary artery of native heart without angina pectoris   3. Hyperlipidemia LDL goal <70     PLAN:    Supraventricular premature beats (PACs) - Increased PACs frequency compared to 2022, asymptomatic, no atrial fibrillation detected. - Monitor for dyspnea, unexplained fatigue, leg swelling, dizziness. - Educated on reporting new symptoms. - No repeat echocardiogram needed; previous normal ejection fraction. - Avoid excessive caffeine.   CAD - no angina symptoms   HLN - on crestor  5 mg daily. Last LDL , 70   - Schedule follow-up in 16 weeks to review results and  discuss management.  The patient is in agreement with the above plan. The patient left the office in stable condition.  The patient will follow up in   Medication Adjustments/Labs and Tests Ordered: Current medicines are reviewed at length with the patient today.  Concerns regarding medicines are outlined above.  No orders of the defined types were placed in this encounter.  No orders of the defined types were placed in this encounter.   Patient Instructions  Medication Instructions:  Your physician recommends that you continue on your current medications as directed. Please refer to the Current Medication list given to you today.  *If you need a refill on your cardiac medications before your next appointment, please call your pharmacy*  Follow-Up: At [facility], you and your health needs are our priority.  As part of our continuing mission to provide you with exceptional heart care, our providers are all part of one team.  This team includes your primary  Cardiologist (physician) and Advanced Practice Providers or APPs (Physician Assistants and Nurse Practitioners) who all work together to provide you with the care you need, when you need it.  Your next appointment:   1 year(s)  Provider:   Anyely Cunning, DO       Adopting a Healthy Lifestyle.  Know what a healthy weight is for you (roughly BMI <25) and aim to maintain this   Aim for 7+ servings of fruits and vegetables daily   65-80+ fluid ounces of water or unsweet tea for healthy kidneys   Limit to max 1 drink of alcohol per day; avoid smoking/tobacco   Limit animal fats in diet for cholesterol and heart health - choose grass fed whenever available   Avoid highly processed foods, and foods high in saturated/trans fats   Aim for low stress - take time to unwind and care for your mental health   Aim for 150 min of moderate intensity exercise weekly for heart health, and weights twice weekly for bone health   Aim for 7-9 hours of sleep daily   When it comes to diets, agreement about the perfect plan isnt easy to find, even among the experts. Experts at the [facility] [employer] developed an idea known as the Healthy Eating Plate. Just imagine a plate divided into logical, healthy portions.   The emphasis is on diet quality:   Load up on vegetables and fruits - one-half of your plate: Aim for color and variety, and remember that potatoes dont count.   Go for whole grains - one-quarter of your plate: Whole wheat, barley, wheat berries, quinoa, oats, brown rice, and foods made with them. If you want pasta, go with whole wheat pasta.   Protein power - one-quarter of your plate: Fish, chicken, beans, and nuts are all healthy, versatile protein sources. Limit red meat.   The diet, however, does go beyond the plate, offering a few other suggestions.   Use healthy plant oils, such as olive, canola, soy, corn, sunflower and peanut. Check the labels, and avoid partially  hydrogenated oil, which have unhealthy trans fats.   If youre thirsty, drink water. Coffee and tea are good in moderation, but skip sugary drinks and limit milk and dairy products to one or two daily servings.   The type of carbohydrate in the diet is more important than the amount. Some sources of carbohydrates, such as vegetables, fruits, whole grains, and beans-are healthier than others.   Finally, stay active  Signed, Orien Mayhall,  DO  02/11/2024 11:13 PM    [facility]

## 2024-03-09 ENCOUNTER — Ambulatory Visit: Admitting: Family Medicine

## 2024-03-09 ENCOUNTER — Encounter: Payer: Self-pay | Admitting: Family Medicine

## 2024-03-09 VITALS — BP 126/58 | Ht 61.0 in | Wt 105.0 lb

## 2024-03-09 DIAGNOSIS — M25552 Pain in left hip: Secondary | ICD-10-CM | POA: Diagnosis not present

## 2024-03-09 NOTE — Progress Notes (Signed)
 DATE OF VISIT: 03/09/2024        Tran Pamela DOB: 03-21-1941 MRN: [id number]  CC:  f/u Lt hip pain  History of present Illness: [name] is a 83 y.o. female who presents for a follow-up visit  Last seen by me 12/28/23 - was recommended PT, Voltaren Gel, Salonpas prn - completed xrays with findings noted below  Since last visit she reports has been doing much better Completed 12 sessions of PT with dry needling - last session was about 02/17/24 - has doing regular HEP 3-days /wk Back to walking more - averaging about 7100-7200 steps/day (about 3-miles) - In addition to hip pain, also has exercise-induced A-fib, therefore tries to limit her steps/exercise.  Getting approximately 5 notifications a month from her Apple watch about A-fib.  Does follow with a cardiologist regularly for this Still occasional left lateral hip pain  Medications:  Outpatient Encounter Medications as of 03/09/2024  Medication Sig   Calcium  Citrate (CITRACAL PO) Take by mouth.   Cholecalciferol (VITAMIN D) 2000 UNITS CAPS Take 4,000 Units by mouth daily.    Coenzyme Q10 (COQ10 PO) Take by mouth.   COLLAGEN PO Take by mouth.   levothyroxine (SYNTHROID) 50 MCG tablet Take 50 mcg by mouth daily.   loperamide (IMODIUM) 2 MG capsule Take by mouth.   Magnesium 200 MG TABS Take 200 mg by mouth daily.   Multiple Minerals-Vitamins (CALCIUM -MAGNESIUM-ZINC-D3) TABS Take by mouth.   Quercetin 500 MG CAPS    rosuvastatin  (CRESTOR ) 5 MG tablet Take 1 tablet (5 mg total) by mouth daily.   Triamcinolone Acetonide (NASACORT AQ NA) Place into the nose.   TURMERIC CURCUMIN PO Take by mouth.   Facility-Administered Encounter Medications as of 03/09/2024  Medication   0.9 %  sodium chloride  infusion    Allergies: is allergic to penicillins.  Physical Examination: Vitals: BP (!) 126/58   Ht 5' 1 (1.549 m)   Wt 105 lb (47.6 kg)   BMI 19.84 kg/m  GENERAL:  [name] is a 83 y.o. female appearing their  stated age, alert and oriented x 3, in no apparent distress.  SKIN: no rashes or lesions, skin clean, dry, intact MSK: Hip: Left hip with good range of motion without pain.  Mild tenderness palpation over the greater trochanter.  Hip strength 5 -/5 throughout.  Negative FABER, negative FADIR. Right hip with full range of motion without pain, weakness, instability Walking without a limp Neurovascularly intact distally  Radiology: Left hip and pelvis x-ray 12/28/2023 showing: IMPRESSION: 1. No acute osseous injury of the left hip. 2.  No significant arthropathy of left hip.  LSpine x-ray 12/28/2023 showing: FINDINGS: There is no evidence of lumbar spine fracture. Scoliosis. Grade 1 anterolisthesis of L4 on L5. Mild facet joint sclerosis noted in the lower lumbar spine. Intervertebral disc spaces are maintained.   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  Likely related to altered mechanics from mild scoliosis  Plan: - X-ray results reviewed in detail - Should continue with home exercises as she is doing - Is okay to increase her walking and activity as tolerated - Can continue over-the-counter Voltaren gel or other remedies as needed - Will follow-up with me on an as-needed basis.  If has worsening symptoms or return could consider further imaging and/or possible injection   Patient expressed understanding & agreement with above.  Encounter Diagnosis  Name Primary?   Left hip pain Yes    No orders of the defined types were placed in this encounter.
